# Patient Record
Sex: Female | Born: 1975 | Race: White | Hispanic: No | Marital: Married | Smoking: Never smoker
Health system: Southern US, Community
[De-identification: ages and names within clinical notes are randomized; demographics above are authoritative.]

## PROBLEM LIST (undated history)

## (undated) DIAGNOSIS — Z87440 Personal history of urinary (tract) infections: Secondary | ICD-10-CM

## (undated) DIAGNOSIS — Z8481 Family history of carrier of genetic disease: Secondary | ICD-10-CM

## (undated) DIAGNOSIS — Z87442 Personal history of urinary calculi: Secondary | ICD-10-CM

## (undated) HISTORY — DX: Personal history of urinary (tract) infections: Z87.440

## (undated) HISTORY — DX: Family history of carrier of genetic disease: Z84.81

---

## 2006-02-23 ENCOUNTER — Emergency Department: Payer: Self-pay | Admitting: Emergency Medicine

## 2007-07-06 ENCOUNTER — Emergency Department: Payer: Self-pay | Admitting: Emergency Medicine

## 2007-11-16 ENCOUNTER — Emergency Department: Payer: Self-pay | Admitting: Emergency Medicine

## 2008-04-29 ENCOUNTER — Emergency Department: Payer: Self-pay | Admitting: Emergency Medicine

## 2008-05-07 ENCOUNTER — Ambulatory Visit: Payer: Self-pay | Admitting: Urology

## 2008-05-08 ENCOUNTER — Ambulatory Visit: Payer: Self-pay | Admitting: Urology

## 2008-12-11 ENCOUNTER — Ambulatory Visit: Payer: Self-pay | Admitting: Urology

## 2009-03-26 ENCOUNTER — Emergency Department: Payer: Self-pay | Admitting: Emergency Medicine

## 2009-10-24 HISTORY — PX: TUBAL LIGATION: SHX77

## 2010-06-25 ENCOUNTER — Observation Stay: Payer: Self-pay | Admitting: Obstetrics and Gynecology

## 2010-08-24 ENCOUNTER — Inpatient Hospital Stay (HOSPITAL_COMMUNITY): Admission: AD | Admit: 2010-08-24 | Discharge: 2010-08-24 | Payer: Self-pay | Admitting: Obstetrics and Gynecology

## 2010-08-24 ENCOUNTER — Inpatient Hospital Stay (HOSPITAL_COMMUNITY): Admission: AD | Admit: 2010-08-24 | Discharge: 2010-08-27 | Payer: Self-pay | Admitting: Obstetrics and Gynecology

## 2010-09-15 ENCOUNTER — Ambulatory Visit (HOSPITAL_COMMUNITY)
Admission: RE | Admit: 2010-09-15 | Discharge: 2010-09-15 | Payer: Self-pay | Source: Home / Self Care | Admitting: Obstetrics and Gynecology

## 2011-01-04 LAB — COMPREHENSIVE METABOLIC PANEL
AST: 18 U/L (ref 0–37)
Albumin: 2.9 g/dL — ABNORMAL LOW (ref 3.5–5.2)
Calcium: 8.5 mg/dL (ref 8.4–10.5)
Creatinine, Ser: 0.91 mg/dL (ref 0.4–1.2)
GFR calc Af Amer: >60 mL/min (ref >60)

## 2011-01-04 LAB — CBC
HCT: 29 % — ABNORMAL LOW (ref 36.0–46.0)
HCT: 38.6 % (ref 36.0–46.0)
Hemoglobin: 9.8 g/dL — ABNORMAL LOW (ref 12.0–15.0)
MCH: 30.3 pg (ref 26.0–34.0)
MCHC: 34.3 g/dL (ref 30.0–36.0)
MCV: 88.2 fL (ref 78.0–100.0)
MCV: 89.2 fL (ref 78.0–100.0)
Platelets: 241 10*3/uL (ref 150–400)
Platelets: 291 10*3/uL (ref 150–400)
RBC: 3.25 MIL/uL — ABNORMAL LOW (ref 3.87–5.11)
RBC: 3.59 MIL/uL — ABNORMAL LOW (ref 3.87–5.11)
RBC: 4.37 MIL/uL (ref 3.87–5.11)
WBC: 12.9 10*3/uL — ABNORMAL HIGH (ref 4.0–10.5)
WBC: 8.6 10*3/uL (ref 4.0–10.5)

## 2011-07-14 ENCOUNTER — Emergency Department: Payer: Self-pay | Admitting: *Deleted

## 2013-11-15 ENCOUNTER — Emergency Department: Payer: Self-pay | Admitting: Emergency Medicine

## 2013-11-17 ENCOUNTER — Emergency Department: Payer: Self-pay | Admitting: Emergency Medicine

## 2013-11-17 LAB — URINALYSIS, COMPLETE
BILIRUBIN, UR: NEGATIVE
GLUCOSE, UR: NEGATIVE mg/dL (ref 0–75)
Nitrite: POSITIVE
PH: 5 (ref 4.5–8.0)
Protein: >=500
Specific Gravity: 1.025 (ref 1.003–1.030)

## 2013-11-17 LAB — CBC WITH DIFFERENTIAL/PLATELET
BASOS PCT: 0.3 %
Basophil #: 0 10*3/uL (ref 0.0–0.1)
Eosinophil #: 0.1 10*3/uL (ref 0.0–0.7)
Eosinophil %: 1.4 %
HCT: 42.6 % (ref 35.0–47.0)
HGB: 14.1 g/dL (ref 12.0–16.0)
LYMPHS PCT: 19.2 %
Lymphocyte #: 1.2 10*3/uL (ref 1.0–3.6)
MCH: 28.8 pg (ref 26.0–34.0)
MCHC: 33 g/dL (ref 32.0–36.0)
MCV: 87 fL (ref 80–100)
MONO ABS: 0.5 x10 3/mm (ref 0.2–0.9)
Monocyte %: 7.9 %
NEUTROS ABS: 4.4 10*3/uL (ref 1.4–6.5)
NEUTROS PCT: 71.2 %
Platelet: 167 10*3/uL (ref 150–440)
RBC: 4.88 10*6/uL (ref 3.80–5.20)
RDW: 13.4 % (ref 11.5–14.5)
WBC: 6.2 10*3/uL (ref 3.6–11.0)

## 2013-11-17 LAB — COMPREHENSIVE METABOLIC PANEL
ALBUMIN: 3.8 g/dL (ref 3.4–5.0)
AST: 26 U/L (ref 15–37)
Alkaline Phosphatase: 67 U/L
Anion Gap: 4 — ABNORMAL LOW (ref 7–16)
BILIRUBIN TOTAL: 0.2 mg/dL (ref 0.2–1.0)
BUN: 12 mg/dL (ref 7–18)
CREATININE: 1.03 mg/dL (ref 0.60–1.30)
Calcium, Total: 8.3 mg/dL — ABNORMAL LOW (ref 8.5–10.1)
Chloride: 106 mmol/L (ref 98–107)
Co2: 28 mmol/L (ref 21–32)
EGFR (African American): >60
EGFR (Non-African Amer.): >60
GLUCOSE: 90 mg/dL (ref 65–99)
Osmolality: 275 (ref 275–301)
POTASSIUM: 3.4 mmol/L — AB (ref 3.5–5.1)
SGPT (ALT): 22 U/L (ref 12–78)
Sodium: 138 mmol/L (ref 136–145)
TOTAL PROTEIN: 6.8 g/dL (ref 6.4–8.2)

## 2013-11-20 LAB — URINE CULTURE

## 2015-04-20 LAB — HM PAP SMEAR

## 2015-04-22 ENCOUNTER — Ambulatory Visit: Payer: Self-pay | Attending: Oncology

## 2015-04-22 ENCOUNTER — Ambulatory Visit
Admission: RE | Admit: 2015-04-22 | Discharge: 2015-04-22 | Disposition: A | Discharge status: Home / Self Care | Payer: Self-pay | Source: Ambulatory Visit | Attending: Oncology | Admitting: Oncology

## 2015-04-22 VITALS — BP 101/68 | HR 84 | Temp 98.0°F | Resp 16 | Ht 64.96 in | Wt 156.1 lb

## 2015-04-22 DIAGNOSIS — Z0000 Encounter for general adult medical examination without abnormal findings: Secondary | ICD-10-CM

## 2015-04-22 NOTE — Progress Notes (Signed)
Subjective:     Patient ID: Paige [name], female   DOB: [birth year], 39 y.o.   MRN: [id number]  HPI   Review of Systems     Objective:   Physical Exam  Pulmonary/Chest: Right breast exhibits no inverted nipple, no mass, no nipple discharge, no skin change and no tenderness. Left breast exhibits no inverted nipple, no mass, no nipple discharge, no skin change and no tenderness. Breasts are symmetrical.  Genitourinary: No labial fusion. There is no rash, tenderness, lesion or injury on the right labia. There is no rash, tenderness, lesion or injury on the left labia. Uterus is not deviated. Cervix exhibits no motion tenderness, no discharge and no friability. Right adnexum displays no mass, no tenderness and no fullness. Left adnexum displays no mass, no tenderness and no fullness. No erythema or tenderness in the vagina. No vaginal discharge found.       Assessment:     39 year old patient presents for BCCCP clinic visitPatient screened, and meets BCCCP eligibility.  Patient does not have insurance, Medicare or Medicaid.  Handout given on Affordable Care Act.  CBE unremarkable. Instructed patient on breast self-exam using teach back method. Patient rports her mother had positive BRCA testin at [facility], and she plans for testing.  She has a 22 year old daughter, a 33 year old son, and a 43 year old son.         Plan:     Sent for baseline bilateral screening mammogram. Specimen collected  for pap.

## 2015-04-22 NOTE — Addendum Note (Signed)
Addended byScarlett Presto: Natacha Jepsen F on: 04/22/2015 04:47 PM   Modules accepted: Orders

## 2015-04-24 ENCOUNTER — Encounter: Payer: Self-pay | Admitting: *Deleted

## 2015-04-24 DIAGNOSIS — N6489 Other specified disorders of breast: Secondary | ICD-10-CM

## 2015-04-24 DIAGNOSIS — Z1371 Encounter for nonprocreative screening for genetic disease carrier status: Secondary | ICD-10-CM

## 2015-04-24 HISTORY — DX: Encounter for nonprocreative screening for genetic disease carrier status: Z13.71

## 2015-04-24 LAB — PAP LB AND HPV HIGH-RISK
HPV, high-risk: NEGATIVE
PAP Smear Comment: 0

## 2015-04-28 ENCOUNTER — Ambulatory Visit
Admission: RE | Admit: 2015-04-28 | Discharge: 2015-04-28 | Disposition: A | Discharge status: Home / Self Care | Payer: Self-pay | Source: Ambulatory Visit | Attending: Oncology | Admitting: Oncology

## 2015-04-28 DIAGNOSIS — N6001 Solitary cyst of right breast: Secondary | ICD-10-CM | POA: Insufficient documentation

## 2015-04-28 DIAGNOSIS — N6489 Other specified disorders of breast: Secondary | ICD-10-CM

## 2015-04-28 DIAGNOSIS — N63 Unspecified lump in breast: Secondary | ICD-10-CM | POA: Insufficient documentation

## 2015-04-30 ENCOUNTER — Other Ambulatory Visit: Payer: Self-pay

## 2015-04-30 DIAGNOSIS — N63 Unspecified lump in unspecified breast: Secondary | ICD-10-CM

## 2015-04-30 NOTE — Progress Notes (Signed)
Scheduled patient for 6 month follow-up diagnostic mammogram of right breast with right breast ultrasound on Friday January 6, at 9:20 a.m.Marland Kitchen.  Left message for patient to call back for notification of appointment.

## 2015-04-30 NOTE — Progress Notes (Signed)
Notified patient of 6 month follow-up mammogram, and ultrasound 10/30/2015 at 9:20.  Also reported to patient ASCUS with negative HPV pap results.  She will be due for next pap in 5 years per BCCCP guidelines.

## 2015-10-30 ENCOUNTER — Ambulatory Visit: Payer: Self-pay | Attending: Oncology

## 2015-10-30 ENCOUNTER — Other Ambulatory Visit: Payer: Self-pay

## 2015-12-04 ENCOUNTER — Ambulatory Visit
Admission: RE | Admit: 2015-12-04 | Discharge: 2015-12-04 | Disposition: A | Discharge status: Home / Self Care | Payer: Self-pay | Source: Ambulatory Visit | Attending: Oncology | Admitting: Oncology

## 2015-12-04 ENCOUNTER — Other Ambulatory Visit: Payer: Self-pay

## 2015-12-04 DIAGNOSIS — N63 Unspecified lump in unspecified breast: Secondary | ICD-10-CM

## 2016-01-18 LAB — HM MAMMOGRAPHY

## 2016-03-18 ENCOUNTER — Encounter: Payer: Self-pay | Admitting: Internal Medicine

## 2016-03-18 ENCOUNTER — Ambulatory Visit (INDEPENDENT_AMBULATORY_CARE_PROVIDER_SITE_OTHER): Payer: BLUE CROSS/BLUE SHIELD | Admitting: Internal Medicine

## 2016-03-18 VITALS — BP 110/80 | HR 74 | Temp 98.1°F | Ht 66.0 in | Wt 152.0 lb

## 2016-03-18 DIAGNOSIS — R635 Abnormal weight gain: Secondary | ICD-10-CM

## 2016-03-18 DIAGNOSIS — B351 Tinea unguium: Secondary | ICD-10-CM

## 2016-03-18 DIAGNOSIS — R5383 Other fatigue: Secondary | ICD-10-CM | POA: Diagnosis not present

## 2016-03-18 DIAGNOSIS — E049 Nontoxic goiter, unspecified: Secondary | ICD-10-CM | POA: Diagnosis not present

## 2016-03-18 DIAGNOSIS — E010 Iodine-deficiency related diffuse (endemic) goiter: Secondary | ICD-10-CM

## 2016-03-18 DIAGNOSIS — R490 Dysphonia: Secondary | ICD-10-CM | POA: Diagnosis not present

## 2016-03-18 DIAGNOSIS — K2100 Gastro-esophageal reflux disease with esophagitis, without bleeding: Secondary | ICD-10-CM

## 2016-03-18 DIAGNOSIS — F411 Generalized anxiety disorder: Secondary | ICD-10-CM

## 2016-03-18 LAB — CBC WITH DIFFERENTIAL/PLATELET
BASOS ABS: 0 10*3/uL (ref 0.0–0.1)
Basophils Relative: 0.4 % (ref 0.0–3.0)
EOS ABS: 0.7 10*3/uL (ref 0.0–0.7)
EOS PCT: 7.9 % — AB (ref 0.0–5.0)
HCT: 42 % (ref 36.0–46.0)
Hemoglobin: 13.9 g/dL (ref 12.0–15.0)
LYMPHS ABS: 2.9 10*3/uL (ref 0.7–4.0)
Lymphocytes Relative: 32.5 % (ref 12.0–46.0)
MCHC: 33.1 g/dL (ref 30.0–36.0)
MCV: 84.8 fl (ref 78.0–100.0)
MONO ABS: 0.7 10*3/uL (ref 0.1–1.0)
Monocytes Relative: 7.4 % (ref 3.0–12.0)
NEUTROS PCT: 51.8 % (ref 43.0–77.0)
Neutro Abs: 4.6 10*3/uL (ref 1.4–7.7)
Platelets: 353 10*3/uL (ref 150.0–400.0)
RBC: 4.95 Mil/uL (ref 3.87–5.11)
RDW: 13.7 % (ref 11.5–15.5)
WBC: 8.9 10*3/uL (ref 4.0–10.5)

## 2016-03-18 LAB — COMPREHENSIVE METABOLIC PANEL
ALBUMIN: 4.8 g/dL (ref 3.5–5.2)
ALK PHOS: 60 U/L (ref 39–117)
ALT: 14 U/L (ref 0–35)
AST: 18 U/L (ref 0–37)
BILIRUBIN TOTAL: 0.4 mg/dL (ref 0.2–1.2)
BUN: 9 mg/dL (ref 6–23)
CALCIUM: 9.6 mg/dL (ref 8.4–10.5)
CO2: 27 mEq/L (ref 19–32)
CREATININE: 0.9 mg/dL (ref 0.40–1.20)
Chloride: 104 mEq/L (ref 96–112)
GFR: 73.09 mL/min (ref >60.00)
Glucose, Bld: 72 mg/dL (ref 70–99)
Potassium: 3.7 mEq/L (ref 3.5–5.1)
SODIUM: 140 meq/L (ref 135–145)
TOTAL PROTEIN: 7 g/dL (ref 6.0–8.3)

## 2016-03-18 LAB — VITAMIN B12: VITAMIN B 12: 711 pg/mL (ref 211–911)

## 2016-03-18 LAB — TSH: TSH: 1.87 u[IU]/mL (ref 0.35–4.50)

## 2016-03-18 LAB — IBC PANEL
IRON: 55 ug/dL (ref 42–145)
Saturation Ratios: 15.2 % — ABNORMAL LOW (ref 20.0–50.0)
TRANSFERRIN: 259 mg/dL (ref 212.0–360.0)

## 2016-03-18 MED ORDER — ALPRAZOLAM 0.5 MG PO TABS: 0.2500 mg | ORAL_TABLET | Freq: Two times a day (BID) | ORAL | Status: DC | PRN

## 2016-03-18 MED ORDER — OMEPRAZOLE 40 MG PO CPDR: 40.0000 mg | DELAYED_RELEASE_CAPSULE | Freq: Every day | ORAL | Status: DC

## 2016-03-18 NOTE — Progress Notes (Addendum)
Subjective:  Patient ID: Paige [name], female    DOB: January 02, 1976  Age: 40 y.o. MRN: [id number]  CC: The primary encounter diagnosis was Thyromegaly. Diagnoses of Anxiety state, Weight gain, Hoarseness, Gastroesophageal reflux disease with esophagitis, Other fatigue, and Onychomycosis of toenail were also pertinent to this visit.  HPI Paige [name] presents for establishment of care   Cc:  Fatigue.  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  She also has 3 children:   50 yr old  daughter , 33 yr old son (both from first marriage) and a  79 yr old son who has been hard to control due to uncontrolled behaviroral issues and is in process of being evaluated. Her 22 yr old son had a traumatic brain injury after surviving a MVA last yr and lives at home as well. Her husband has untreated bipolar disorder,  OCD and ADD and has just started his own business.  She also has a grandchild and an estranged step father who continues to rely on her heavily for emotional support despite her mother's divorce from him.  She feels stressed out constantly.  And often goes to be crying because she knows she has been iritable toward her sons.   She does not  feel that her husband shares in the domestic family responsibilities as much as he could,  And she has no time to herself.   Trouble focusing, feels she has ADHD  Has gained 30 lbs over the  Last 3 years.  No regular exercise. Father has an exercise room with equipment at the office that she can use but has not.   Voice has become hoarse.  Has GERD but does not treat consistently,  For the past  4 or 5 years.  Symptoms occur day and night.  Aggravated by certain foods,  Some dysphagia.   No dry cough. Use prn prilosec   Using lamisil for toenail  fungus prescribed by Dr Nicole Kindred,  lfts were reportedly normal   Has regularly occurring menses,,  5 days in duration . PAP smear ASCUS HPV negative June 2016 Mammogram every 6  months due to [facility] .  Last one March . BRCA 1 negative  Mom has BRCA gene . Maternal Aunt and MGM had ovarian CA, leukemia and breast  CA    History Ladawna has a past medical history of UTI (urinary tract infection) and Kidney stones.   She has past surgical history that includes Tubal ligation (2011).   Her family history includes BRCA 1/2 in her mother; Breast cancer (age of onset: 40) in her maternal aunt; Breast cancer (age of onset: 105) in her maternal grandmother.She reports that she has never smoked. She does not have any smokeless tobacco history on file. She reports that she does not drink alcohol or use illicit drugs.  No outpatient prescriptions prior to visit.   No facility-administered medications prior to visit.    Review of Systems:  Patient denies headache, fevers, malaise, unintentional weight loss, skin rash, eye pain, sinus congestion and sinus pain, sore throat, dysphagia,  hemoptysis , cough, dyspnea, wheezing, chest pain, palpitations, orthopnea, edema, abdominal pain, nausea, melena, diarrhea, constipation, flank pain, dysuria, hematuria, urinary  Frequency, nocturia, numbness, tingling, seizures,  Focal weakness, Loss of consciousness,  Tremor, insomnia, depression, anxiety, and suicidal ideation.     Objective:  BP 110/80 mmHg  Pulse 74  Temp(Src) 98.1 F (36.7 C) (Oral)  Ht _0  (1.676 m)  Wt 152 lb (68.947 kg)  BMI 24.55 kg/m2  Physical Exam:  General appearance: alert, cooperative and appears stated age Ears: normal TM's and external ear canals both ears Throat: lips, mucosa, and tongue normal; teeth and gums normal Neck: no adenopathy, no carotid bruit, supple, symmetrical, trachea midline and thyroid diffusely  enlarged, symmetric, no tenderness/mass/nodules Back: symmetric, no curvature. ROM normal. No CVA tenderness. Lungs: clear to auscultation bilaterally Heart: regular rate and rhythm, S1, S2 normal, no murmur, click, rub or gallop Abdomen:  soft, non-tender; bowel sounds normal; no masses,  no organomegaly Pulses: 2+ and symmetric Skin: Skin color, texture, turgor normal. No rashes or lesions Lymph nodes: Cervical, supraclavicular, and axillary nodes normal.   Assessment & Plan:   Problem List Items Addressed This Visit    Thyromegaly - Primary    On exam,  Mild,  With hoarseness, dysphagia and faitgue reported.  Checking [place] and TSH  Lab Results  Component Value Date   TSH 1.87 03/18/2016         Relevant Orders   TSH (Completed)   US SOFT TISSUE NECK   Hoarseness    Unclear if this is due to goiter or persistent GERD.  Prescribing PPI and ordering [place].  If [place] is normal may need ENT evaluation       Other fatigue    Likely multifactorial with significant emotional demands of raising 2 sons,  Both with social/neuroligic dysfunction,  And a husband who also has emotional difficulties.  Thyroid and anemia ruled out.  Stressed need to be self supportive and guarding of her time.  Encouraged regular exercise ,  Healthy diet. Prn alprzolam for insomnia   Lab Results  Component Value Date   WBC 8.9 03/18/2016   HGB 13.9 03/18/2016   HCT 42.0 03/18/2016   MCV 84.8 03/18/2016   PLT 353.0 03/18/2016         Relevant Orders   Comprehensive metabolic panel (Completed)   TSH (Completed)   CBC with Differential/Platelet (Completed)   IBC panel (Completed)   B12 (Completed)   Gastroesophageal reflux disease with esophagitis    Without cough.  Present for 4-5 years  Daily PPI advised,       Onychomycosis of toenail    Treated with [facility] by Dr Nicole Kindred  Lab Results  Component Value Date   ALT 14 03/18/2016   AST 18 03/18/2016   ALKPHOS 60 03/18/2016   BILITOT 0.4 03/18/2016         Relevant Medications   terbinafine (LAMISIL) 250 MG tablet   Anxiety state    Discussed SSRI therapy but she is very concerned about gaining more weight.  Prn alprazolam,  0.5 mg 1/2 tablet twice daily maximum dose.        Relevant Medications   ALPRAZolam (XANAX) 0.5 MG tablet    Other Visit Diagnoses    Weight gain           I am having Ms. Molder start on ALPRAZolam and omeprazole. I am also having her maintain her terbinafine.  Meds ordered this encounter  Medications  . terbinafine (LAMISIL) 250 MG tablet    Sig: Take 250 mg by mouth daily.  Marland Kitchen ALPRAZolam (XANAX) 0.5 MG tablet    Sig: Take 0.5 tablets (0.25 mg total) by mouth 2 (two) times daily as needed for anxiety or sleep.    Dispense:  30 tablet    Refill:  2  . omeprazole (PRILOSEC) 40 MG capsule    Sig:  Take 1 capsule (40 mg total) by mouth daily.    Dispense:  30 capsule    Refill:  3    There are no discontinued medications.  Follow-up: No Follow-up on file.   Crecencio Mc, MD

## 2016-03-18 NOTE — Patient Instructions (Signed)
Alprazolam 1/2 tablet not more than twice daily for anxiety/insomnia  Prilosec 40 mg daily in the AM  For reflux  US thyroid to be scheduled  RTC one month   Gastroesophageal Reflux Disease, Adult Normally, food travels down the esophagus and stays in the stomach to be digested. However, when a person has gastroesophageal reflux disease (GERD), food and stomach acid move back up into the esophagus. When this happens, the esophagus becomes sore and inflamed. Over time, GERD can create small holes (ulcers) in the lining of the esophagus.  CAUSES This condition is caused by a problem with the muscle between the esophagus and the stomach (lower esophageal sphincter, or LES). Normally, the LES muscle closes after food passes through the esophagus to the stomach. When the LES is weakened or abnormal, it does not close properly, and that allows food and stomach acid to go back up into the esophagus. The LES can be weakened by certain dietary substances, medicines, and medical conditions, including:  Tobacco use.  Pregnancy.  Having a hiatal hernia.  Heavy alcohol use.  Certain foods and beverages, such as coffee, chocolate, onions, and peppermint. RISK FACTORS This condition is more likely to develop in:  People who have an increased body weight.  People who have connective tissue disorders.  People who use NSAID medicines. SYMPTOMS Symptoms of this condition include:  Heartburn.  Difficult or painful swallowing.  The feeling of having a lump in the throat.  Abitter taste in the mouth.  Bad breath.  Having a large amount of saliva.  Having an upset or bloated stomach.  Belching.  Chest pain.  Shortness of breath or wheezing.  Ongoing (chronic) cough or a night-time cough.  Wearing away of tooth enamel.  Weight loss. Different conditions can cause chest pain. Make sure to see your health care provider if you experience chest pain. DIAGNOSIS Your health care  provider will take a medical history and perform a physical exam. To determine if you have mild or severe GERD, your health care provider may also monitor how you respond to treatment. You may also have other tests, including:  An endoscopy toexamine your stomach and esophagus with a small camera.  A test thatmeasures the acidity level in your esophagus.  A test thatmeasures how much pressure is on your esophagus.  A barium swallow or modified barium swallow to show the shape, size, and functioning of your esophagus. TREATMENT The goal of treatment is to help relieve your symptoms and to prevent complications. Treatment for this condition may vary depending on how severe your symptoms are. Your health care provider may recommend:  Changes to your diet.  Medicine.  Surgery. HOME CARE INSTRUCTIONS Diet  Follow a diet as recommended by your health care provider. This may involve avoiding foods and drinks such as:  Coffee and tea (with or without caffeine).  Drinks that containalcohol.  Energy drinks and sports drinks.  Carbonated drinks or sodas.  Chocolate and cocoa.  Peppermint and mint flavorings.  Garlic and onions.  Horseradish.  Spicy and acidic foods, including peppers, chili powder, curry powder, vinegar, hot sauces, and barbecue sauce.  Citrus fruit juices and citrus fruits, such as oranges, lemons, and limes.  Tomato-based foods, such as red sauce, chili, salsa, and pizza with red sauce.  Fried and fatty foods, such as donuts, french fries, potato chips, and high-fat dressings.  High-fat meats, such as hot dogs and fatty cuts of red and white meats, such as rib eye steak, sausage,  ham, and bacon.  High-fat dairy items, such as whole milk, butter, and cream cheese.  Eat small, frequent meals instead of large meals.  Avoid drinking large amounts of liquid with your meals.  Avoid eating meals during the 2-3 hours before bedtime.  Avoid lying down right  after you eat.  Do not exercise right after you eat. General Instructions  Pay attention to any changes in your symptoms.  Take over-the-counter and prescription medicines only as told by your health care provider. Do not take aspirin, ibuprofen, or other NSAIDs unless your health care provider told you to do so.  Do not use any tobacco products, including cigarettes, chewing tobacco, and e-cigarettes. If you need help quitting, ask your health care provider.  Wear loose-fitting clothing. Do not wear anything tight around your waist that causes pressure on your abdomen.  Raise (elevate) the head of your bed 6 inches (15cm).  Try to reduce your stress, such as with yoga or meditation. If you need help reducing stress, ask your health care provider.  If you are overweight, reduce your weight to an amount that is healthy for you. Ask your health care provider for guidance about a safe weight loss goal.  Keep all follow-up visits as told by your health care provider. This is important. SEEK MEDICAL CARE IF:  You have new symptoms.  You have unexplained weight loss.  You have difficulty swallowing, or it hurts to swallow.  You have wheezing or a persistent cough.  Your symptoms do not improve with treatment.  You have a hoarse voice. SEEK IMMEDIATE MEDICAL CARE IF:  You have pain in your arms, neck, jaw, teeth, or back.  You feel sweaty, dizzy, or light-headed.  You have chest pain or shortness of breath.  You vomit and your vomit looks like blood or coffee grounds.  You faint.  Your stool is bloody or black.  You cannot swallow, drink, or eat.   This information is not intended to replace advice given to you by your health care provider. Make sure you discuss any questions you have with your health care provider.   Document Released: 07/20/2005 Document Revised: 07/01/2015 Document Reviewed: 02/04/2015 Elsevier Interactive Patient Education [employer]2016 Elsevier Inc.

## 2016-03-18 NOTE — Progress Notes (Signed)
Pre visit review using our clinic review tool, if applicable. No additional management support is needed unless otherwise documented below in the visit note. 

## 2016-03-19 ENCOUNTER — Encounter: Payer: Self-pay | Admitting: Internal Medicine

## 2016-03-19 DIAGNOSIS — B351 Tinea unguium: Secondary | ICD-10-CM | POA: Insufficient documentation

## 2016-03-19 DIAGNOSIS — R490 Dysphonia: Secondary | ICD-10-CM | POA: Insufficient documentation

## 2016-03-19 DIAGNOSIS — K2100 Gastro-esophageal reflux disease with esophagitis, without bleeding: Secondary | ICD-10-CM | POA: Insufficient documentation

## 2016-03-19 DIAGNOSIS — E041 Nontoxic single thyroid nodule: Secondary | ICD-10-CM | POA: Insufficient documentation

## 2016-03-19 DIAGNOSIS — R5383 Other fatigue: Secondary | ICD-10-CM | POA: Insufficient documentation

## 2016-03-19 DIAGNOSIS — F411 Generalized anxiety disorder: Secondary | ICD-10-CM | POA: Insufficient documentation

## 2016-03-19 NOTE — Assessment & Plan Note (Signed)
On exam,  Mild,  With hoarseness, dysphagia and faitgue reported.  Checking US and TSH  Lab Results  Component Value Date   TSH 1.87 03/18/2016

## 2016-03-19 NOTE — Assessment & Plan Note (Signed)
Likely multifactorial with significant emotional demands of raising 2 sons,  Both with social/neuroligic dysfunction,  And a husband who also has emotional difficulties.  Thyroid and anemia ruled out.  Stressed need to be self supportive and guarding of her time.  Encouraged regular exercise ,  Healthy diet. Prn alprzolam for insomnia   Lab Results  Component Value Date   WBC 8.9 03/18/2016   HGB 13.9 03/18/2016   HCT 42.0 03/18/2016   MCV 84.8 03/18/2016   PLT 353.0 03/18/2016

## 2016-03-19 NOTE — Assessment & Plan Note (Signed)
Treated with Lamisili by Dr Roseanne RenoStewart  Lab Results  Component Value Date   ALT 14 03/18/2016   AST 18 03/18/2016   ALKPHOS 60 03/18/2016   BILITOT 0.4 03/18/2016

## 2016-03-19 NOTE — Assessment & Plan Note (Signed)
Without cough.  Present for 4-5 years  Daily PPI advised,

## 2016-03-19 NOTE — Assessment & Plan Note (Signed)
Unclear if this is due to goiter or persistent GERD.  Prescribing PPI and ordering US.  If US is normal may need ENT evaluation

## 2016-03-19 NOTE — Assessment & Plan Note (Signed)
Discussed SSRI therapy but she is very concerned about gaining more weight.  Prn alprazolam,  0.5 mg 1/2 tablet twice daily maximum dose.

## 2016-03-22 ENCOUNTER — Encounter: Payer: Self-pay | Admitting: *Deleted

## 2016-03-22 ENCOUNTER — Telehealth: Payer: Self-pay | Admitting: Internal Medicine

## 2016-03-22 NOTE — Telephone Encounter (Signed)
Patient would like a call back regarding her labs that was drawn on 5/26. Pt cb [phone]

## 2016-03-22 NOTE — Telephone Encounter (Signed)
Patient notified voiced understanding

## 2016-03-24 ENCOUNTER — Ambulatory Visit: Payer: BLUE CROSS/BLUE SHIELD

## 2016-03-25 ENCOUNTER — Ambulatory Visit
Admission: RE | Admit: 2016-03-25 | Discharge: 2016-03-25 | Disposition: A | Discharge status: Home / Self Care | Payer: BLUE CROSS/BLUE SHIELD | Source: Ambulatory Visit | Attending: Internal Medicine | Admitting: Internal Medicine

## 2016-03-25 DIAGNOSIS — E049 Nontoxic goiter, unspecified: Secondary | ICD-10-CM | POA: Insufficient documentation

## 2016-03-25 DIAGNOSIS — E010 Iodine-deficiency related diffuse (endemic) goiter: Secondary | ICD-10-CM

## 2016-03-27 ENCOUNTER — Encounter: Payer: Self-pay | Admitting: Internal Medicine

## 2016-03-27 ENCOUNTER — Other Ambulatory Visit: Payer: Self-pay | Admitting: Internal Medicine

## 2016-03-27 DIAGNOSIS — E041 Nontoxic single thyroid nodule: Secondary | ICD-10-CM

## 2016-04-21 ENCOUNTER — Encounter: Payer: Self-pay | Admitting: Internal Medicine

## 2016-04-21 ENCOUNTER — Ambulatory Visit (INDEPENDENT_AMBULATORY_CARE_PROVIDER_SITE_OTHER): Payer: BLUE CROSS/BLUE SHIELD | Admitting: Internal Medicine

## 2016-04-21 VITALS — BP 104/78 | HR 86 | Resp 14 | Ht 66.0 in | Wt 151.0 lb

## 2016-04-21 DIAGNOSIS — R41840 Attention and concentration deficit: Secondary | ICD-10-CM

## 2016-04-21 DIAGNOSIS — R928 Other abnormal and inconclusive findings on diagnostic imaging of breast: Secondary | ICD-10-CM | POA: Diagnosis not present

## 2016-04-21 DIAGNOSIS — E041 Nontoxic single thyroid nodule: Secondary | ICD-10-CM

## 2016-04-21 DIAGNOSIS — R490 Dysphonia: Secondary | ICD-10-CM | POA: Diagnosis not present

## 2016-04-21 DIAGNOSIS — F411 Generalized anxiety disorder: Secondary | ICD-10-CM

## 2016-04-21 MED ORDER — ALPRAZOLAM 0.25 MG PO TABS: 0.2500 mg | ORAL_TABLET | Freq: Every day | ORAL | Status: DC | PRN

## 2016-04-21 MED ORDER — SERTRALINE HCL 50 MG PO TABS: 50.0000 mg | ORAL_TABLET | Freq: Every day | ORAL | Status: DC

## 2016-04-21 NOTE — Patient Instructions (Signed)
Please start the  Sertraline (generic for Zoloft ) at 1/2 tablet daily in the evening for the first few days to avoid nausea.  You can increase to a full tablet after a week if you have not developed side effects of nausea.  If the medicatio interferes with your sleep, take it in the morning instead   We can increase the dose to 100 mg after 3 weeks if you do not feel any improvement in your mood.  Just let me know  I have refilled the alprazolam at the lowest dose so you can cut it in half instead of 1/4's

## 2016-04-21 NOTE — Progress Notes (Signed)
Subjective:  Patient ID: Paige [name], female    DOB: [birth year]  Age: 40 y.o. MRN: [id number]  CC: The primary encounter diagnosis was Concentration deficit. Diagnoses of Abnormal mammogram of right breast, Thyroid nodule, Hoarseness, and Anxiety state were also pertinent to this visit.  HPI Paige GhaziMelissa Gomes presents for follow up on multiple issues.  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  She has been cutting the alprazolam 0.5 mg tablet into 1/4's and and it using it prn insomnia.  he feels anxious during the day and reports having difficulty concentrating and focusing and reports that the symptoms have been present since junior high but never treated.  She admits to trying one of her husband's Adderall and felt that her concentration improved significantly with no side effects.     Outpatient Prescriptions Prior to Visit  Medication Sig Dispense Refill  . omeprazole (PRILOSEC) 40 MG capsule Take 1 capsule (40 mg total) by mouth daily. 30 capsule 3  . ALPRAZolam (XANAX) 0.5 MG tablet Take 0.5 tablets (0.25 mg total) by mouth 2 (two) times daily as needed for anxiety or sleep. (Patient not taking: Reported on 04/21/2016) 30 tablet 2  . terbinafine (LAMISIL) 250 MG tablet Take 250 mg by mouth daily. Reported on 04/21/2016     No facility-administered medications prior to visit.    Review of Systems;  Patient denies headache, fevers, malaise, unintentional weight loss, skin rash, eye pain, sinus congestion and sinus pain, sore throat, dysphagia,  hemoptysis , cough, dyspnea, wheezing, chest pain, palpitations, orthopnea, edema, abdominal pain, nausea, melena, diarrhea, constipation, flank pain, dysuria, hematuria, urinary  Frequency, nocturia,  numbness, tingling, seizures,  Focal weakness, Loss of consciousness,  Tremor, insomnia, depression,, and suicidal ideation.      Objective:  BP 104/78 mmHg  Pulse 86  Resp 14  Ht 5\' 6"  (1.676 m)  Wt 151 lb (68.493 kg)  BMI 24.38 kg/m2  SpO2 98%  LMP 04/11/2016  BP Readings from Last 3 Encounters:  04/21/16 104/78  03/18/16 110/80  04/22/15 101/68    Wt Readings from Last 3 Encounters:  04/21/16 151 lb (68.493 kg)  03/18/16 152 lb (68.947 kg)  04/22/15 156 lb 1.4 oz (70.8 kg)    General appearance: alert, cooperative and appears stated age Ears: normal TM's and external ear canals both ears Throat: lips, mucosa, and tongue normal; teeth and gums normal Neck: no adenopathy, no carotid bruit, supple, symmetrical, trachea midline and thyroid not enlarged, symmetric, no tenderness/mass/nodules Back: symmetric, no curvature. ROM normal. No CVA tenderness. Lungs: clear to auscultation bilaterally Heart: regular rate and rhythm, S1, S2 normal, no murmur, click, rub or gallop Abdomen: soft, non-tender; bowel sounds normal; no masses,  no organomegaly Pulses: 2+ and symmetric Skin: Skin color, texture, turgor normal. No rashes or lesions Lymph nodes: Cervical, supraclavicular, and axillary nodes normal.  No results found for: HGBA1C  Lab Results  Component Value Date   CREATININE 0.90 03/18/2016   CREATININE 1.03 11/17/2013   CREATININE 0.91 08/25/2010    Lab Results  Component Value Date   WBC 8.9 03/18/2016   HGB 13.9 03/18/2016   HCT 42.0 03/18/2016   PLT 353.0 03/18/2016   GLUCOSE 72 03/18/2016   ALT 14 03/18/2016   AST  18 03/18/2016   NA 140 03/18/2016   K 3.7 03/18/2016   CL 104 03/18/2016   CREATININE 0.90 03/18/2016   BUN 9 03/18/2016   CO2 27 03/18/2016   TSH 1.87 03/18/2016    [place] Soft Tissue Neck  03/26/2016  CLINICAL DATA:  Thyromegaly, dysphagia EXAM: THYROID ULTRASOUND TECHNIQUE: Ultrasound examination of the thyroid gland and adjacent soft tissues  was performed. COMPARISON:  None. FINDINGS: Right thyroid lobe Measurements: 4.3 x 1.2 x 1.4 cm. Homogeneous background echotexture. 4 mm hypoechoic solid nodule in the right mid upper pole incidentally noted. No other significant right thyroid abnormality. Left thyroid lobe Measurements: 4.0 x 1.0 x 1.3 cm.  No nodules visualized. Isthmus Thickness: 2 mm.  No nodules visualized. Lymphadenopathy Benign-appearing cervical lymph nodes bilaterally.  No adenopathy. IMPRESSION: Incidental 4 mm right upper pole hypoechoic solid nodule. No other significant finding. Electronically Signed   By: Judie PetitM.  Shick M.D.   On: 03/26/2016 08:33    Assessment & Plan:   Problem List Items Addressed This Visit    Thyroid nodule    She has been referred to Endocrinology for workup of a 4 mm nodule.  Thyroid function is normal.  Lab Results  Component Value Date   TSH 1.87 03/18/2016         Hoarseness    Not due to goiter,  Improving with PPI use for presumed GERD       Anxiety state    Trial of zoloft advised alprazolam dose reduced to 0.25 mg .       Relevant Medications   sertraline (ZOLOFT) 50 MG tablet   ALPRAZolam (XANAX) 0.25 MG tablet   Concentration deficit - Primary    Her symptoms have been present since adolescence with no prior formal evaluation .   Referral to Dr Reggy EyeAltabet for formal evaluation .        Relevant Orders   Ambulatory referral to Psychiatry   Abnormal mammogram of right breast    6 month follow up has been ordered      Relevant Orders   MM Digital Diagnostic Bilat   US BREAST COMPLETE UNI RIGHT INC AXILLA      I have discontinued Ms. Gengler's terbinafine. I have also changed her ALPRAZolam. Additionally, I am having her start on sertraline. Lastly, I am having her maintain her omeprazole.  Meds ordered this encounter  Medications  . sertraline (ZOLOFT) 50 MG tablet    Sig: Take 1 tablet (50 mg total) by mouth daily.    Dispense:  30 tablet    Refill:  3  .  ALPRAZolam (XANAX) 0.25 MG tablet    Sig: Take 1 tablet (0.25 mg total) by mouth daily as needed for anxiety or sleep.    Dispense:  30 tablet    Refill:  5    PHARMACY NOTE DOSE   CHANGE    Medications Discontinued During This Encounter  Medication Reason  . ALPRAZolam (XANAX) 0.5 MG tablet Reorder  . terbinafine (LAMISIL) 250 MG tablet   A total of 25 minutes of face to face time was spent with patient more than half of which was spent in counselling about the above mentioned conditions  and coordination of care   Follow-up: Return in about 6 months (around 10/21/2016).   Sherlene ShamsULLO, Kaelen Caughlin L, MD

## 2016-04-22 ENCOUNTER — Other Ambulatory Visit: Payer: Self-pay | Admitting: Internal Medicine

## 2016-04-22 DIAGNOSIS — R928 Other abnormal and inconclusive findings on diagnostic imaging of breast: Secondary | ICD-10-CM

## 2016-04-23 DIAGNOSIS — R41840 Attention and concentration deficit: Secondary | ICD-10-CM | POA: Insufficient documentation

## 2016-04-23 DIAGNOSIS — R928 Other abnormal and inconclusive findings on diagnostic imaging of breast: Secondary | ICD-10-CM | POA: Insufficient documentation

## 2016-04-23 NOTE — Assessment & Plan Note (Signed)
Trial of zoloft advised alprazolam dose reduced to 0.25 mg .

## 2016-04-23 NOTE — Assessment & Plan Note (Signed)
Her symptoms have been present since adolescence with no prior formal evaluation .   Referral to Dr Reggy EyeAltabet for formal evaluation .

## 2016-04-23 NOTE — Assessment & Plan Note (Signed)
She has been referred to Endocrinology for workup of a 4 mm nodule.  Thyroid function is normal.  Lab Results  Component Value Date   TSH 1.87 03/18/2016

## 2016-04-23 NOTE — Assessment & Plan Note (Signed)
Not due to goiter,  Improving with PPI use for presumed GERD

## 2016-04-23 NOTE — Assessment & Plan Note (Signed)
6 month follow up has been ordered

## 2016-04-27 ENCOUNTER — Ambulatory Visit: Payer: Self-pay | Admitting: Endocrinology

## 2016-05-02 ENCOUNTER — Ambulatory Visit (INDEPENDENT_AMBULATORY_CARE_PROVIDER_SITE_OTHER): Payer: BLUE CROSS/BLUE SHIELD | Admitting: Endocrinology

## 2016-05-02 ENCOUNTER — Encounter: Payer: Self-pay | Admitting: Endocrinology

## 2016-05-02 VITALS — BP 106/70 | HR 83 | Ht 66.0 in | Wt 147.0 lb

## 2016-05-02 DIAGNOSIS — E041 Nontoxic single thyroid nodule: Secondary | ICD-10-CM

## 2016-05-02 DIAGNOSIS — F32A Depression, unspecified: Secondary | ICD-10-CM

## 2016-05-02 DIAGNOSIS — F329 Major depressive disorder, single episode, unspecified: Secondary | ICD-10-CM | POA: Diagnosis not present

## 2016-05-02 NOTE — Progress Notes (Signed)
Patient ID: Paige [name], female   DOB: Mar 28, 1976, 40 y.o.   MRN: [id number]           Reason for Appointment: Evaluation of thyroid nodule    History of Present Illness:   The patient's thyroid nodule was first discovered on a thyroid ultrasound done because of clinical impression of goiter. The patient had gone to her primary care physician complaining of getting choked on certain foods and was told that she had a thyroid enlargement.   The thyroid ultrasound showed the following Normal thyroid lobes. 4 mm hypoechoic nodule in right upper pole  She was also complaining of fatigue and weight gain and TSH level was checked.  Lab Results  Component Value Date   TSH 1.87 03/18/2016      Medication List       This list is accurate as of: 05/02/16  2:37 PM.  Always use your most recent med list.               ALPRAZolam 0.25 MG tablet  Commonly known as:  XANAX  Take 1 tablet (0.25 mg total) by mouth daily as needed for anxiety or sleep.     omeprazole 40 MG capsule  Commonly known as:  PRILOSEC  Take 1 capsule (40 mg total) by mouth daily.     sertraline 50 MG tablet  Commonly known as:  ZOLOFT  Take 1 tablet (50 mg total) by mouth daily.        Allergies: No Known Allergies  Past Medical History  Diagnosis Date  . Hx: UTI (urinary tract infection)   . Kidney stones     There is no history of radiation to the neck in childhood  Past Surgical History  Procedure Laterality Date  . Tubal ligation  2011    Family History  Problem Relation Age of Onset  . BRCA 1/2 Mother     tested positive for the BRCA Gene  . Breast cancer Maternal Aunt 50  . Breast cancer Maternal Grandmother 37  . Thyroid disease Neg Hx     Social History:  reports that she has never smoked. She does not have any smokeless tobacco history on file. She reports that she does not drink alcohol or use illicit drugs.   Review of Systems:   Review of Systems  Constitutional:  Positive for weight loss.       30 pounds over 2 years  HENT:       She gets choked on certain foods  Respiratory: Positive for daytime sleepiness.   Gastrointestinal: Negative for constipation.  Endocrine: Positive for fatigue, abnormal weight gain and cold intolerance. Negative for general weakness.       Has mild chronic cold intolerance, not worse recently Has decreased libido  Musculoskeletal: Negative for joint pain.  Psychiatric/Behavioral: Positive for depressed mood and insomnia.    She has normal menstrual cycles although heavy in the first 3 days         Examination:   BP 106/70 mmHg  Pulse 83  Ht 5' 6"  (1.676 m)  Wt 147 lb (66.679 kg)  BMI 23.74 kg/m2  SpO2 97%  LMP 04/11/2016   General Appearance:  well-looking        Eyes: No  prominence or swelling of the eyes         THYROID: There is no enlargement of the thyroid. No thyroid nodules felt  Heart sounds normal Lungs clear Abdomen shows no hepatosplenomegaly or other mass.  Reflexes at biceps are normal, ankle reflex difficult to elicit.  Skin: No rash evident Extremities: No edema  Assessment/Plan:  Thyroid nodule: She has an incidental 4 mm thyroid nodule Discussed that nodules less than 10 mm are clinically insignificant and that at least 95% of nodules are benign anyway Also described to the patient that incidental small nodules are common in women. Reassured her that the nodule is of no clinical consequence   She does not have any family History of thyroid or other significant malignancies  Currently she does not need any further evaluation. She can have a follow-up ultrasound done in about a year for completeness through her PCP  DEPRESSION, insomnia and decreased libido: She is not improved with Zoloft and this causes drowsiness. Recommended that she may try Wellbutrin instead.  She will discuss with her PCP  Follow-up in the office as needed   [facility] 05/02/2016

## 2016-06-01 ENCOUNTER — Ambulatory Visit: Payer: Self-pay

## 2016-06-03 ENCOUNTER — Other Ambulatory Visit: Payer: Self-pay | Admitting: Internal Medicine

## 2016-06-03 ENCOUNTER — Ambulatory Visit
Admission: RE | Admit: 2016-06-03 | Discharge: 2016-06-03 | Disposition: A | Discharge status: Home / Self Care | Payer: BLUE CROSS/BLUE SHIELD | Source: Ambulatory Visit | Attending: Internal Medicine | Admitting: Internal Medicine

## 2016-06-03 DIAGNOSIS — R928 Other abnormal and inconclusive findings on diagnostic imaging of breast: Secondary | ICD-10-CM

## 2016-06-03 DIAGNOSIS — N63 Unspecified lump in breast: Secondary | ICD-10-CM | POA: Insufficient documentation

## 2016-10-21 ENCOUNTER — Ambulatory Visit: Payer: Self-pay | Admitting: Internal Medicine

## 2016-10-21 DIAGNOSIS — Z0289 Encounter for other administrative examinations: Secondary | ICD-10-CM

## 2016-11-12 IMAGING — MG MM DIGITAL DIAGNOSTIC BILAT W/ TOMO W/ CAD
8 of 13 series · 8 of 29 positions shown · non-contrast
Comparison: Previous exam(s).

CLINICAL DATA: Followup for probably benign right breast masses.

EXAM:
2D DIGITAL DIAGNOSTIC BILATERAL MAMMOGRAM WITH CAD AND ADJUNCT TOMO
RIGHT BREAST ULTRASOUND

[R MLO (1 of 2)]
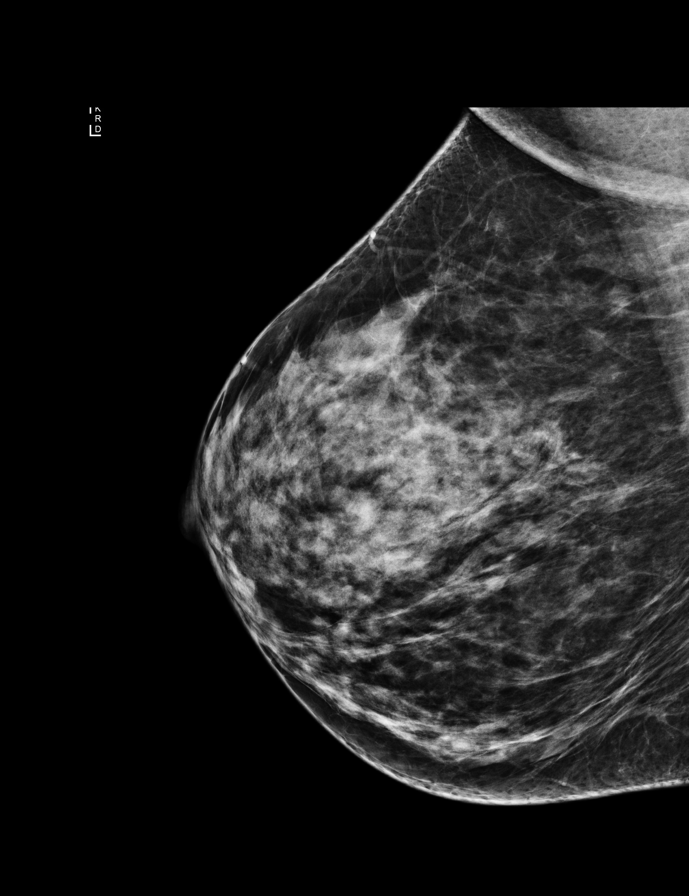

[R MLO (2 of 2)]
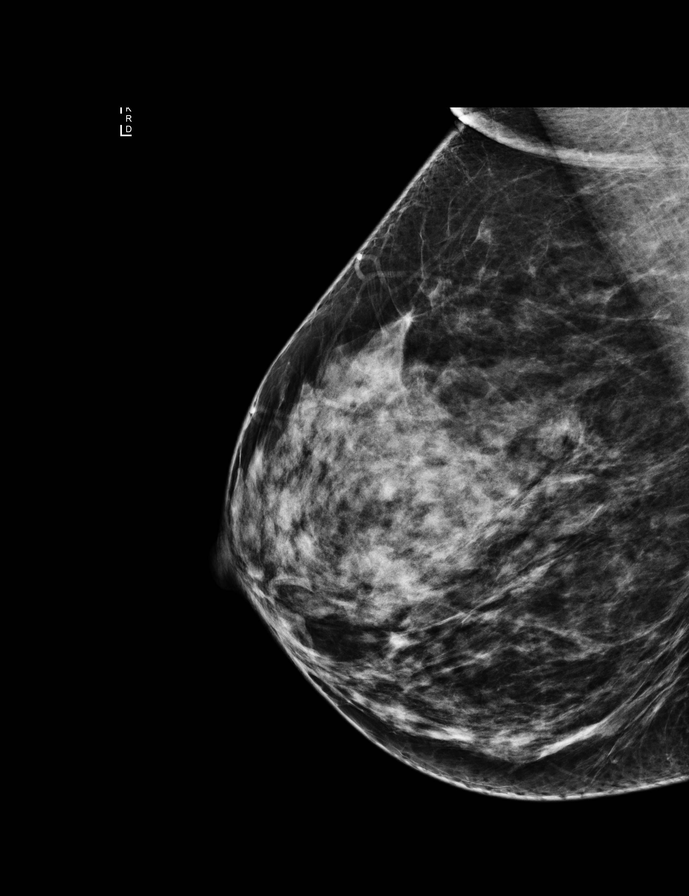

[R MLO synth-2D]
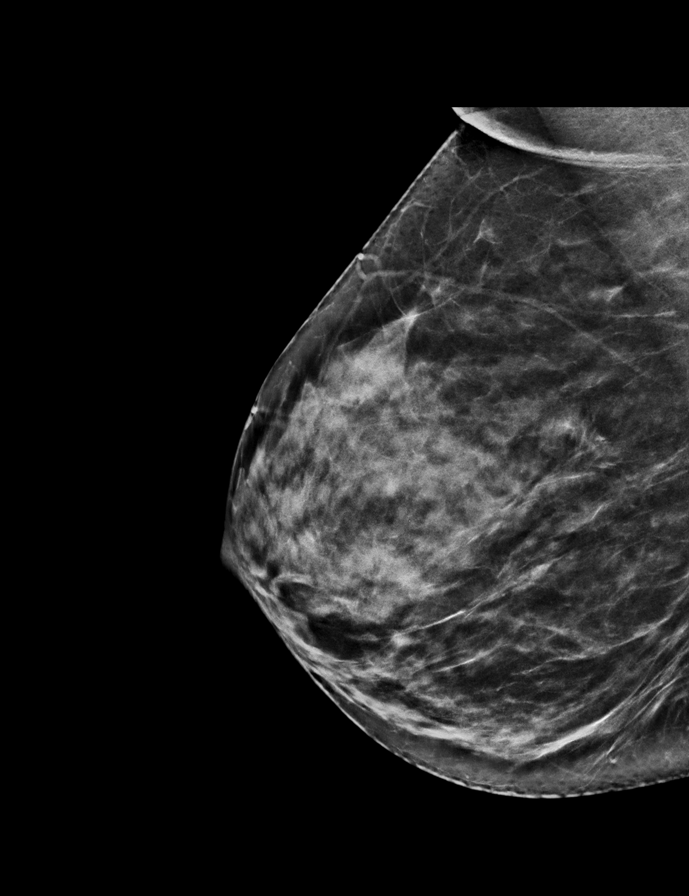

[L MLO]
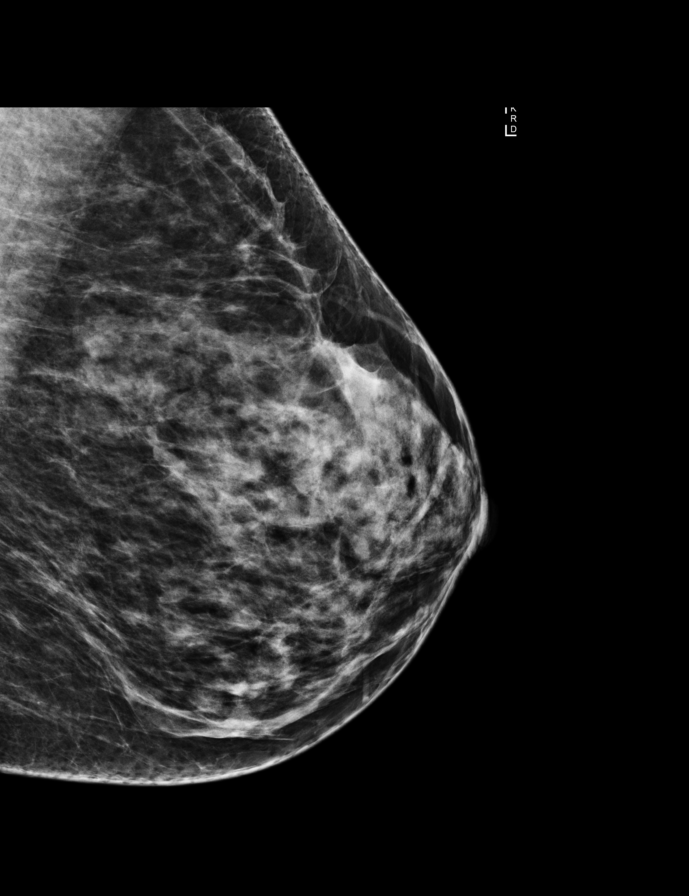

[R CC]
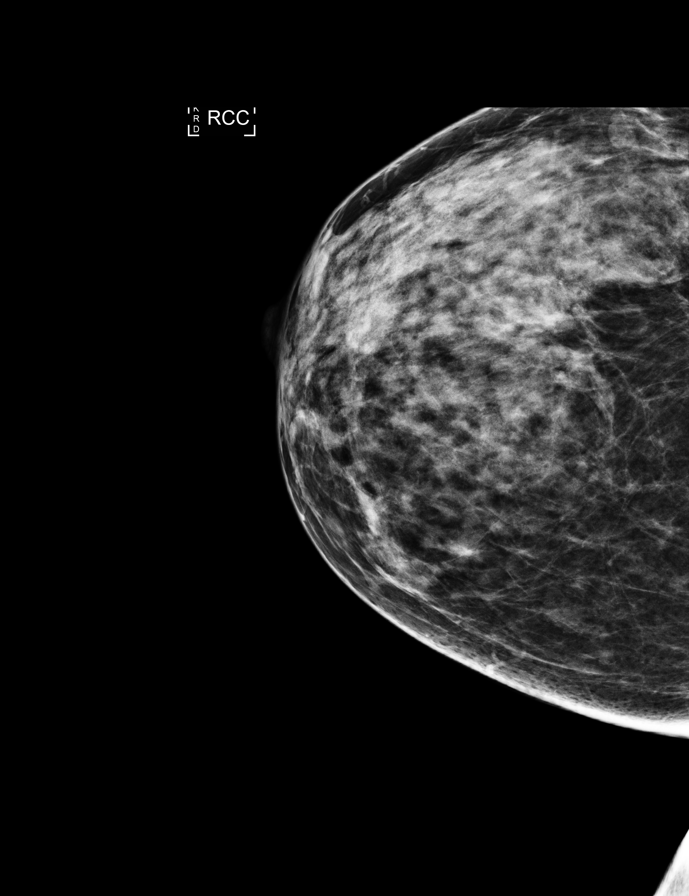

[L CC synth-2D]
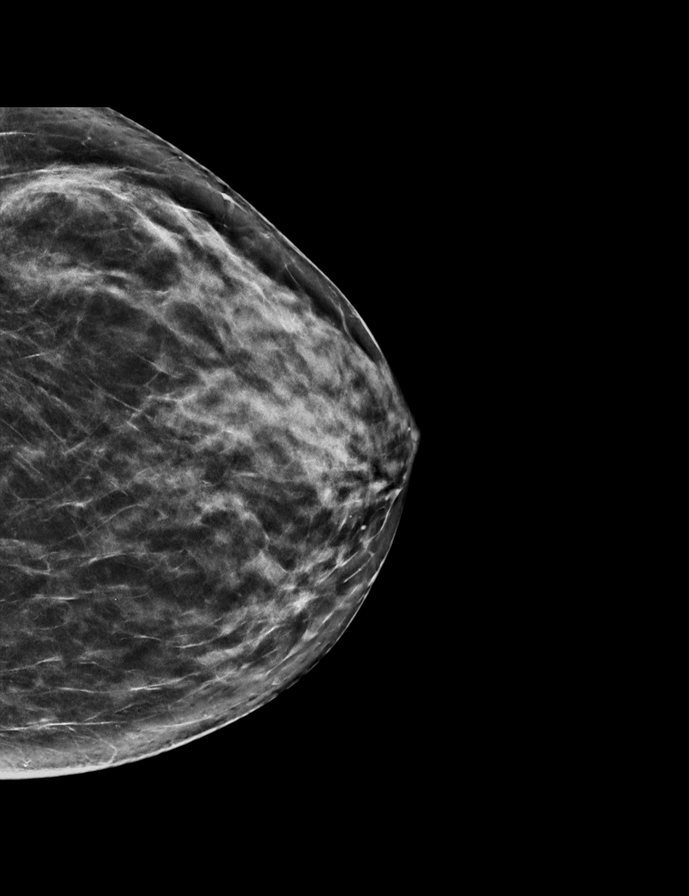

[L MLO synth-2D]
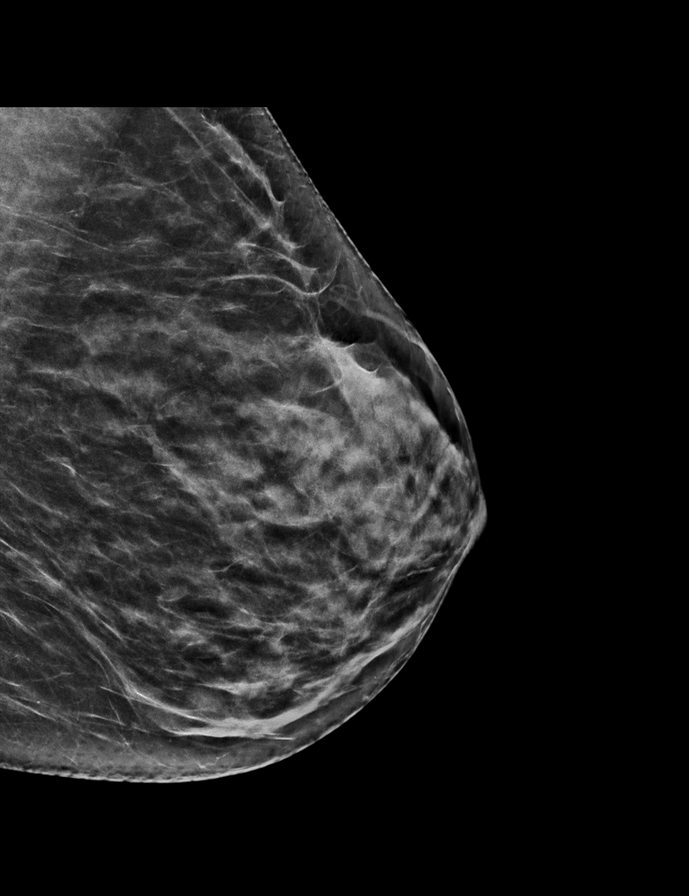

[L CC]
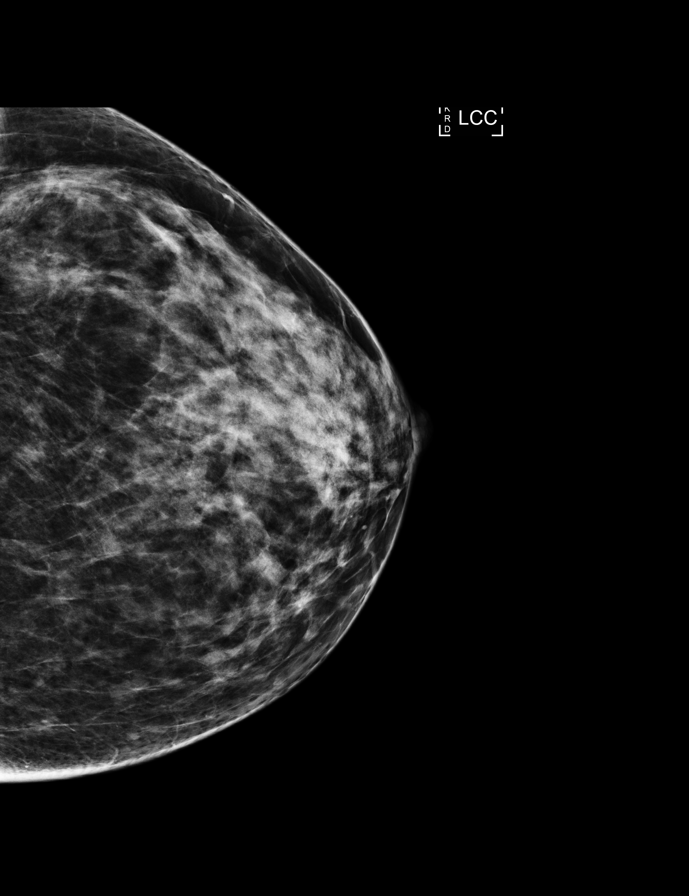

[8 of 29 positions shown; findings below may reference images not displayed]

ACR Breast Density Category c: The breast tissue is heterogeneously
dense, which may obscure small masses.
FINDINGS: No suspicious masses or calcifications are is in either breast.
There is no mammographic evidence of malignancy in either breast.
The oval circumscribed mass in the outer right breast appears
unchanged.

Mammographic images were processed with CAD.

Targeted ultrasound of the right breast was performed demonstrating
an unchanged cluster of cysts at 9 o'clock 8 cm from nipple
measuring 0.6 x 0.4 x 0.8 cm, unchanged in size and appearance. The
circumscribed anechoic mass with posterior acoustic enhancement in
the right breast at [DATE] 2 cm from the nipple measures 0.6 x 0.5 by
0.6 cm, unchanged and the adjacent similar appearing mass measures
0.5 x 0.3x 0.4 cm. These 2 masses are unchanged and demonstrate
imaging features consistent with small cysts although are too deep
in location to fully characterize.
IMPRESSION: 1.  No mammographic evidence of malignancy in either breast.

2.  Unchanged probably benign masses at [DATE] in the right breast.

RECOMMENDATION:
Bilateral diagnostic mammography with right breast ultrasound in 12
months which will demonstrate 2 years of stability of the probably
benign right breast masses.

I have discussed the findings and recommendations with the patient.
Results were also provided in writing at the conclusion of the
visit. If applicable, a reminder letter will be sent to the patient
regarding the next appointment.

BI-RADS CATEGORY  3: Probably benign.

## 2016-11-13 ENCOUNTER — Other Ambulatory Visit: Payer: Self-pay | Admitting: Internal Medicine

## 2016-11-14 NOTE — Telephone Encounter (Signed)
Pt last refill of Prilosec was on 08/12/16. Pt last OV was on 04/18/16 and last labs were on 03/18/16. Pt has no future scheduled appts.

## 2017-03-29 ENCOUNTER — Encounter: Payer: Self-pay | Admitting: Internal Medicine

## 2017-03-29 ENCOUNTER — Ambulatory Visit (INDEPENDENT_AMBULATORY_CARE_PROVIDER_SITE_OTHER): Payer: Self-pay | Admitting: Internal Medicine

## 2017-03-29 DIAGNOSIS — F411 Generalized anxiety disorder: Secondary | ICD-10-CM

## 2017-03-29 DIAGNOSIS — E041 Nontoxic single thyroid nodule: Secondary | ICD-10-CM

## 2017-03-29 DIAGNOSIS — J301 Allergic rhinitis due to pollen: Secondary | ICD-10-CM

## 2017-03-29 MED ORDER — OMEPRAZOLE 40 MG PO CPDR: 40.0000 mg | DELAYED_RELEASE_CAPSULE | Freq: Every day | ORAL | 2 refills | Status: DC

## 2017-03-29 MED ORDER — BUPROPION HCL ER (SR) 150 MG PO TB12: 150.0000 mg | ORAL_TABLET | Freq: Two times a day (BID) | ORAL | 3 refills | Status: DC

## 2017-03-29 MED ORDER — BENZONATATE 200 MG PO CAPS: 200.0000 mg | ORAL_CAPSULE | Freq: Three times a day (TID) | ORAL | 1 refills | Status: DC | PRN

## 2017-03-29 MED ORDER — ALPRAZOLAM 0.25 MG PO TABS: 0.2500 mg | ORAL_TABLET | Freq: Every day | ORAL | 5 refills | Status: DC | PRN

## 2017-03-29 MED ORDER — PREDNISONE 10 MG PO TABS: ORAL_TABLET | ORAL | 0 refills | Status: DC

## 2017-03-29 NOTE — Progress Notes (Signed)
Subjective:  Patient ID: Paige [name], female    DOB: [birth year]  Age: 41 y.o. MRN: [id number]  CC: Diagnoses of Anxiety state, Thyroid nodule, and Seasonal allergic rhinitis due to pollen were pertinent to this visit.  HPI Ayzia Day presents for follow up and management of multiple issues:  1) cough congestion,,  Sneezing,  Started 5 days ago  3) weight gain  Has gained 8 lbs since last July  bmi 25.  Still taking zoloft.  Discussed wellbutrin   3( thyroid nodule  4 mm .Marland Kitchen One year follow up US recommend d by endocrine  Last year   Due I late jyuly    Needs annual follow on right breats with bilateral diag and [place] in late august   Outpatient Medications Prior to Visit  Medication Sig Dispense Refill  . ALPRAZolam (XANAX) 0.25 MG tablet Take 1 tablet (0.25 mg total) by mouth daily as needed for anxiety or sleep. 30 tablet 5  . omeprazole (PRILOSEC) 40 MG capsule TAKE 1 CAPSULE (40 MG TOTAL) BY MOUTH DAILY. 90 capsule 1  . sertraline (ZOLOFT) 50 MG tablet Take 1 tablet (50 mg total) by mouth daily. (Patient not taking: Reported on 03/29/2017) 30 tablet 3   No facility-administered medications prior to visit.     Review of Systems;  Patient denies headache, fevers, malaise, unintentional weight loss, skin rash, eye pain,agia,  hemoptysis , cough, dyspnea, wheezing, chest pain, palpitations, orthopnea, edema, abdominal pain, nausea, melena, diarrhea, constipation, flank pain, dysuria, hematuria, urinary  Frequency, nocturia, numbness, tingling, seizures,  Focal weakness, Loss of consciousness,  Tremor, insomnia, And suicidal ideation.      Objective:  BP 114/66 (BP Location: Left Arm, Patient Position: Sitting, Cuff Size: Normal)   Pulse 87   Temp 98 F (36.7 C) (Oral)   Resp 16   Ht 5\' 6"  (1.676 m)   Wt 155 lb 9.6 oz (70.6 kg)   SpO2 98%   BMI 25.11 kg/m   BP Readings from Last 3 Encounters:  03/29/17 114/66  05/02/16 106/70  04/21/16 104/78    Wt Readings from Last  3 Encounters:  03/29/17 155 lb 9.6 oz (70.6 kg)  05/02/16 147 lb (66.7 kg)  04/21/16 151 lb (68.5 kg)    General appearance: alert, cooperative and appears stated age Ears: normal TM's and external ear canals both ears Throat: lips, mucosa, and tongue normal; teeth and gums normal Neck: no adenopathy, no carotid bruit, supple, symmetrical, trachea midline and thyroid not enlarged, symmetric, no tenderness/mass/nodules Back: symmetric, no curvature. ROM normal. No CVA tenderness. Lungs: clear to auscultation bilaterally Heart: regular rate and rhythm, S1, S2 normal, no murmur, click, rub or gallop Abdomen: soft, non-tender; bowel sounds normal; no masses,  no organomegaly Pulses: 2+ and symmetric Skin: Skin color, texture, turgor normal. No rashes or lesions Lymph nodes: Cervical, supraclavicular, and axillary nodes normal.  No results found for: HGBA1C  Lab Results  Component Value Date   CREATININE 0.90 03/18/2016   CREATININE 1.03 11/17/2013   CREATININE 0.91 08/25/2010    Lab Results  Component Value Date   WBC 8.9 03/18/2016   HGB 13.9 03/18/2016   HCT 42.0 03/18/2016   PLT 353.0 03/18/2016   GLUCOSE 72 03/18/2016   ALT 14 03/18/2016   AST 18 03/18/2016   NA 140 03/18/2016   K 3.7 03/18/2016   CL 104 03/18/2016   CREATININE 0.90 03/18/2016   BUN 9 03/18/2016   CO2 27 03/18/2016   TSH 1.87 03/18/2016  US Breast Ltd Uni Right Inc Axilla  Result Date: 06/03/2016 CLINICAL DATA:  Followup for probably benign right breast masses. EXAM: 2D DIGITAL DIAGNOSTIC BILATERAL MAMMOGRAM WITH CAD AND ADJUNCT TOMO RIGHT BREAST ULTRASOUND COMPARISON:  Previous exam(s). ACR Breast Density Category c: The breast tissue is heterogeneously dense, which may obscure small masses. FINDINGS: No suspicious masses or calcifications are is in either breast. There is no mammographic evidence of malignancy in either breast. The oval circumscribed mass in the outer right breast appears unchanged.  Mammographic images were processed with CAD. Targeted ultrasound of the right breast was performed demonstrating an unchanged cluster of cysts at 9 o'clock 8 cm from nipple measuring 0.6 x 0.4 x 0.8 cm, unchanged in size and appearance. The circumscribed anechoic mass with posterior acoustic enhancement in the right breast at 10:30 2 cm from the nipple measures 0.6 x 0.5 by 0.6 cm, unchanged and the adjacent similar appearing mass measures 0.5 x 0.3x 0.4 cm. These 2 masses are unchanged and demonstrate imaging features consistent with small cysts although are too deep in location to fully characterize. IMPRESSION: 1.  No mammographic evidence of malignancy in either breast. 2.  Unchanged probably benign masses at 10:30 in the right breast. RECOMMENDATION: Bilateral diagnostic mammography with right breast ultrasound in 12 months which will demonstrate 2 years of stability of the probably benign right breast masses. I have discussed the findings and recommendations with the patient. Results were also provided in writing at the conclusion of the visit. If applicable, a reminder letter will be sent to the patient regarding the next appointment. BI-RADS CATEGORY  3: Probably benign. Electronically Signed   By: Edwin Cap M.D.   On: 06/03/2016 14:11   Mm Diag Breast Tomo Bilateral  Result Date: 06/03/2016 CLINICAL DATA:  Followup for probably benign right breast masses. EXAM: 2D DIGITAL DIAGNOSTIC BILATERAL MAMMOGRAM WITH CAD AND ADJUNCT TOMO RIGHT BREAST ULTRASOUND COMPARISON:  Previous exam(s). ACR Breast Density Category c: The breast tissue is heterogeneously dense, which may obscure small masses. FINDINGS: No suspicious masses or calcifications are is in either breast. There is no mammographic evidence of malignancy in either breast. The oval circumscribed mass in the outer right breast appears unchanged. Mammographic images were processed with CAD. Targeted ultrasound of the right breast was performed  demonstrating an unchanged cluster of cysts at 9 o'clock 8 cm from nipple measuring 0.6 x 0.4 x 0.8 cm, unchanged in size and appearance. The circumscribed anechoic mass with posterior acoustic enhancement in the right breast at 10:30 2 cm from the nipple measures 0.6 x 0.5 by 0.6 cm, unchanged and the adjacent similar appearing mass measures 0.5 x 0.3x 0.4 cm. These 2 masses are unchanged and demonstrate imaging features consistent with small cysts although are too deep in location to fully characterize. IMPRESSION: 1.  No mammographic evidence of malignancy in either breast. 2.  Unchanged probably benign masses at 10:30 in the right breast. RECOMMENDATION: Bilateral diagnostic mammography with right breast ultrasound in 12 months which will demonstrate 2 years of stability of the probably benign right breast masses. I have discussed the findings and recommendations with the patient. Results were also provided in writing at the conclusion of the visit. If applicable, a reminder letter will be sent to the patient regarding the next appointment. BI-RADS CATEGORY  3: Probably benign. Electronically Signed   By: Edwin Cap M.D.   On: 06/03/2016 14:11    Assessment & Plan:   Problem List Items Addressed This Visit  Thyroid nodule    1 year follow up ultrasound ordered       Anxiety state    She has had weight gain with zoloft and currently her symptoms are less anxiety and more suggestive of depressio and concentration deficit .  Trial of  wellbutrin , weaning zoloft off      Relevant Medications   buPROPion (WELLBUTRIN SR) 150 MG 12 hr tablet   ALPRAZolam (XANAX) 0.25 MG tablet   Allergic rhinitis due to pollen    HEENT EXAM IS NORMAL.  prednisonee taper, allegra         A total of 25 minutes of face to face time was spent with patient more than half of which was spent in counselling about the above mentioned conditions  and coordination of care  I have discontinued Ms. Nienhuis's  sertraline. I am also having her start on predniSONE, buPROPion, and benzonatate. Additionally, I am having her maintain her ALPRAZolam and omeprazole.  Meds ordered this encounter  Medications  . predniSONE (DELTASONE) 10 MG tablet    Sig: 6 tablets on Day 1 , then reduce by 1 tablet daily until gone    Dispense:  21 tablet    Refill:  0  . buPROPion (WELLBUTRIN SR) 150 MG 12 hr tablet    Sig: Take 1 tablet (150 mg total) by mouth 2 (two) times daily.    Dispense:  60 tablet    Refill:  3  . ALPRAZolam (XANAX) 0.25 MG tablet    Sig: Take 1 tablet (0.25 mg total) by mouth daily as needed for anxiety or sleep.    Dispense:  30 tablet    Refill:  5  . omeprazole (PRILOSEC) 40 MG capsule    Sig: Take 1 capsule (40 mg total) by mouth daily.    Dispense:  90 capsule    Refill:  2  . benzonatate (TESSALON) 200 MG capsule    Sig: Take 1 capsule (200 mg total) by mouth 3 (three) times daily as needed for cough.    Dispense:  60 capsule    Refill:  1    Medications Discontinued During This Encounter  Medication Reason  . sertraline (ZOLOFT) 50 MG tablet Patient has not taken in last 30 days  . ALPRAZolam (XANAX) 0.25 MG tablet Reorder  . omeprazole (PRILOSEC) 40 MG capsule Reorder    Follow-up: No Follow-up on file.   Sherlene ShamsULLO, Jilian West L, MD

## 2017-03-29 NOTE — Patient Instructions (Addendum)
We discussed changing therapy for your depression/anxiety .  Trial of wellbutrin twice daily .  Take the second dose by 2 or 3 pm ro avoid insomnia  Continue daily zoloft for one week ,  Then take it  every other Other day for one week,  Then stop .  I have refilled the alprazolam for use at night if needed    We discussed the growing concern about long term use of Prilosec in light of the recently published studies suggesting an association with increased risk of dementia and kidney failure.  I advised  you to try using  Generic Pepcid (famotidine 20 mg)  nce or o twice daily.  If your reflux symptoms are controlled,  You can Continue this medication until it is not  And then resume the prilosec for 2 weeks,  Then continue alternating    For your allergies:   Prednisone  taper for 6 days  Start using allegra or allegra D daily to prevent recurrence .  Tessalon perles (capsules ) for the cough

## 2017-03-30 DIAGNOSIS — J301 Allergic rhinitis due to pollen: Secondary | ICD-10-CM | POA: Insufficient documentation

## 2017-03-30 NOTE — Assessment & Plan Note (Addendum)
She has had weight gain with zoloft and currently her symptoms are less anxiety and more suggestive of depressio and concentration deficit .  Trial of  wellbutrin , weaning zoloft off

## 2017-03-30 NOTE — Assessment & Plan Note (Signed)
1 year follow up ultrasound ordered

## 2017-03-30 NOTE — Assessment & Plan Note (Signed)
HEENT EXAM IS NORMAL.  prednisonee taper, allegra

## 2017-04-07 ENCOUNTER — Encounter: Payer: Self-pay | Admitting: Internal Medicine

## 2017-04-07 DIAGNOSIS — M79605 Pain in left leg: Principal | ICD-10-CM

## 2017-04-07 DIAGNOSIS — M79604 Pain in right leg: Secondary | ICD-10-CM

## 2017-04-11 ENCOUNTER — Other Ambulatory Visit (INDEPENDENT_AMBULATORY_CARE_PROVIDER_SITE_OTHER): Payer: Self-pay

## 2017-04-11 DIAGNOSIS — M79605 Pain in left leg: Secondary | ICD-10-CM

## 2017-04-11 DIAGNOSIS — M79604 Pain in right leg: Secondary | ICD-10-CM

## 2017-04-11 LAB — COMPREHENSIVE METABOLIC PANEL
ALK PHOS: 54 U/L (ref 39–117)
ALT: 12 U/L (ref 0–35)
AST: 15 U/L (ref 0–37)
Albumin: 4.2 g/dL (ref 3.5–5.2)
BUN: 18 mg/dL (ref 6–23)
CALCIUM: 9.1 mg/dL (ref 8.4–10.5)
CO2: 25 meq/L (ref 19–32)
Chloride: 105 mEq/L (ref 96–112)
Creatinine, Ser: 0.81 mg/dL (ref 0.40–1.20)
GFR: 82.11 mL/min (ref >60.00)
GLUCOSE: 82 mg/dL (ref 70–99)
POTASSIUM: 3.6 meq/L (ref 3.5–5.1)
Sodium: 138 mEq/L (ref 135–145)
TOTAL PROTEIN: 6.5 g/dL (ref 6.0–8.3)
Total Bilirubin: 0.3 mg/dL (ref 0.2–1.2)

## 2017-04-11 LAB — CBC WITH DIFFERENTIAL/PLATELET
Basophils Absolute: 0.1 10*3/uL (ref 0.0–0.1)
Basophils Relative: 0.6 % (ref 0.0–3.0)
EOS PCT: 7.5 % — AB (ref 0.0–5.0)
Eosinophils Absolute: 0.7 10*3/uL (ref 0.0–0.7)
HCT: 38.6 % (ref 36.0–46.0)
Hemoglobin: 12.8 g/dL (ref 12.0–15.0)
LYMPHS ABS: 1.9 10*3/uL (ref 0.7–4.0)
Lymphocytes Relative: 21.5 % (ref 12.0–46.0)
MCHC: 33.2 g/dL (ref 30.0–36.0)
MCV: 85.6 fl (ref 78.0–100.0)
MONOS PCT: 7.9 % (ref 3.0–12.0)
Monocytes Absolute: 0.7 10*3/uL (ref 0.1–1.0)
NEUTROS ABS: 5.5 10*3/uL (ref 1.4–7.7)
NEUTROS PCT: 62.5 % (ref 43.0–77.0)
PLATELETS: 337 10*3/uL (ref 150.0–400.0)
RBC: 4.51 Mil/uL (ref 3.87–5.11)
RDW: 13.2 % (ref 11.5–15.5)
WBC: 8.8 10*3/uL (ref 4.0–10.5)

## 2017-04-11 LAB — C-REACTIVE PROTEIN: CRP: 1.9 mg/dL (ref 0.5–20.0)

## 2017-04-11 LAB — SEDIMENTATION RATE: Sed Rate: 21 mm/hr — ABNORMAL HIGH (ref 0–20)

## 2017-04-11 LAB — VITAMIN D 25 HYDROXY (VIT D DEFICIENCY, FRACTURES): VITD: 36.87 ng/mL (ref 30.00–100.00)

## 2017-04-11 LAB — FERRITIN: Ferritin: 27.6 ng/mL (ref 10.0–291.0)

## 2017-04-12 LAB — IRON AND TIBC
%SAT: 31 % (ref 11–50)
IRON: 87 ug/dL (ref 40–190)
TIBC: 281 ug/dL (ref 250–450)
UIBC: 194 ug/dL

## 2017-04-13 ENCOUNTER — Encounter: Payer: Self-pay | Admitting: Internal Medicine

## 2017-04-13 ENCOUNTER — Telehealth: Payer: Self-pay | Admitting: Internal Medicine

## 2017-04-13 NOTE — Telephone Encounter (Signed)
Pt called requesting lab results. Please advise, thank you!  Call pt @ [phone]

## 2017-04-13 NOTE — Telephone Encounter (Signed)
Please advise 

## 2017-05-25 ENCOUNTER — Other Ambulatory Visit: Payer: Self-pay | Admitting: Internal Medicine

## 2017-05-25 ENCOUNTER — Encounter: Payer: Self-pay | Admitting: Internal Medicine

## 2017-05-25 DIAGNOSIS — E041 Nontoxic single thyroid nodule: Secondary | ICD-10-CM

## 2017-05-25 MED ORDER — SERTRALINE HCL 50 MG PO TABS: 50.0000 mg | ORAL_TABLET | Freq: Every day | ORAL | 3 refills | Status: DC

## 2017-05-29 ENCOUNTER — Ambulatory Visit (INDEPENDENT_AMBULATORY_CARE_PROVIDER_SITE_OTHER): Payer: Self-pay | Admitting: Internal Medicine

## 2017-05-29 ENCOUNTER — Encounter: Payer: Self-pay | Admitting: Internal Medicine

## 2017-05-29 ENCOUNTER — Ambulatory Visit
Admission: RE | Admit: 2017-05-29 | Discharge: 2017-05-29 | Disposition: A | Discharge status: Home / Self Care | Payer: Self-pay | Source: Ambulatory Visit | Attending: Internal Medicine | Admitting: Internal Medicine

## 2017-05-29 VITALS — BP 114/78 | HR 99 | Temp 98.2°F | Resp 15 | Ht 66.0 in | Wt 158.8 lb

## 2017-05-29 DIAGNOSIS — F411 Generalized anxiety disorder: Secondary | ICD-10-CM

## 2017-05-29 DIAGNOSIS — R41840 Attention and concentration deficit: Secondary | ICD-10-CM

## 2017-05-29 DIAGNOSIS — E041 Nontoxic single thyroid nodule: Secondary | ICD-10-CM

## 2017-05-29 DIAGNOSIS — R635 Abnormal weight gain: Secondary | ICD-10-CM

## 2017-05-29 MED ORDER — CITALOPRAM HYDROBROMIDE 10 MG PO TABS: 10.0000 mg | ORAL_TABLET | Freq: Every day | ORAL | 1 refills | Status: DC

## 2017-05-29 NOTE — Progress Notes (Addendum)
Subjective:  Patient ID: Paige [name], female    DOB: [birth year]  Age: 41 y.o. MRN: [id number]  CC: The primary encounter diagnosis was Weight gain. Diagnoses of Concentration deficit, Anxiety state, and Thyroid nodule were also pertinent to this visit.  HPI Ritisha L Dedmon presents for medication management,  Not tolerating wellbutrin due to  Recurrent daily headaches .  Stopped it several weeks ago. Symptoms still present,  gets agitated easily , not able to control temper,  Yelling at her children  Husband Jillyn HiddenGary also has ADHD and son is in therapy.  She is still gaining weight.     needs tsh   Wants alternative therapy,  Friend has had good results with abilify but has history of bipolar disorder.     Outpatient Medications Prior to Visit  Medication Sig Dispense Refill  . ALPRAZolam (XANAX) 0.25 MG tablet Take 1 tablet (0.25 mg total) by mouth daily as needed for anxiety or sleep. 30 tablet 5  . omeprazole (PRILOSEC) 40 MG capsule Take 1 capsule (40 mg total) by mouth daily. 90 capsule 2  . buPROPion (WELLBUTRIN SR) 150 MG 12 hr tablet Take 1 tablet (150 mg total) by mouth 2 (two) times daily. 60 tablet 3  . sertraline (ZOLOFT) 50 MG tablet Take 1 tablet (50 mg total) by mouth daily. 30 tablet 3  . benzonatate (TESSALON) 200 MG capsule Take 1 capsule (200 mg total) by mouth 3 (three) times daily as needed for cough. (Patient not taking: Reported on 05/29/2017) 60 capsule 1  . predniSONE (DELTASONE) 10 MG tablet 6 tablets on Day 1 , then reduce by 1 tablet daily until gone (Patient not taking: Reported on 05/29/2017) 21 tablet 0   No facility-administered medications prior to visit.     Review of Systems;  Patient denies headache, fevers, malaise, unintentional weight loss, skin rash, eye pain, sinus congestion and sinus pain, sore throat, dysphagia,  hemoptysis , cough, dyspnea, wheezing, chest pain, palpitations, orthopnea, edema, abdominal pain, nausea, melena, diarrhea,  constipation, flank pain, dysuria, hematuria, urinary  Frequency, nocturia, numbness, tingling, seizures,  Focal weakness, Loss of consciousness,  Tremor, insomnia, depression, anxiety, and suicidal ideation.      Objective:  BP 114/78 (BP Location: Left Arm, Patient Position: Sitting, Cuff Size: Normal)   Pulse 99   Temp 98.2 F (36.8 C) (Oral)   Resp 15   Ht 5\' 6"  (1.676 m)   Wt 158 lb 12.8 oz (72 kg)   SpO2 98%   BMI 25.63 kg/m   BP Readings from Last 3 Encounters:  05/29/17 114/78  03/29/17 114/66  05/02/16 106/70    Wt Readings from Last 3 Encounters:  05/29/17 158 lb 12.8 oz (72 kg)  03/29/17 155 lb 9.6 oz (70.6 kg)  05/02/16 147 lb (66.7 kg)    General appearance: alert, cooperative and appears stated age Ears: normal TM's and external ear canals both ears Throat: lips, mucosa, and tongue normal; teeth and gums normal Neck: no adenopathy, no carotid bruit, supple, symmetrical, trachea midline and thyroid not enlarged, symmetric, no tenderness/mass/nodules Back: symmetric, no curvature. ROM normal. No CVA tenderness. Lungs: clear to auscultation bilaterally Heart: regular rate and rhythm, S1, S2 normal, no murmur, click, rub or gallop Abdomen: soft, non-tender; bowel sounds normal; no masses,  no organomegaly Pulses: 2+ and symmetric Skin: Skin color, texture, turgor normal. No rashes or lesions Lymph nodes: Cervical, supraclavicular, and axillary nodes normal.  No results found for: HGBA1C  Lab Results  Component  Value Date   CREATININE 0.81 04/11/2017   CREATININE 0.90 03/18/2016   CREATININE 1.03 11/17/2013    Lab Results  Component Value Date   WBC 8.8 04/11/2017   HGB 12.8 04/11/2017   HCT 38.6 04/11/2017   PLT 337.0 04/11/2017   GLUCOSE 82 04/11/2017   ALT 12 04/11/2017   AST 15 04/11/2017   NA 138 04/11/2017   K 3.6 04/11/2017   CL 105 04/11/2017   CREATININE 0.81 04/11/2017   BUN 18 04/11/2017   CO2 25 04/11/2017   TSH 0.78 05/29/2017     US Soft Tissue Head/neck  Result Date: 05/29/2017 CLINICAL DATA:  Prior ultrasound follow-up. EXAM: THYROID ULTRASOUND TECHNIQUE: Ultrasound examination of the thyroid gland and adjacent soft tissues was performed. COMPARISON:  03/25/2016 FINDINGS: Parenchymal Echotexture: Normal Isthmus: 0.2 cm, previously 0.2 cm Right lobe: 4.2 x 1.2 x 1.0 cm, previously 4.3 x 1.4 x 1.2 cm Left lobe: 3.9 x 1.3 x 1.0 cm, previously 4.0 x 1.3 x 1.0 cm _________________________________________________________ Estimated total number of nodules >/= 1 cm: 0 Number of spongiform nodules >/=  2 cm not described below (TR1): 0 Number of mixed cystic and solid nodules >/= 1.5 cm not described below (TR2): 0 _________________________________________________________ 0.5 cm hypoechoic nodule in the right lobe does not meet criteria for biopsy or follow-up. IMPRESSION: Small right upper pole nodule does not meet criteria for biopsy for follow-up. No other abnormality. The above is in keeping with the ACR TI-RADS recommendations - J Am Coll Radiol 2017;14:587-595. Electronically Signed   By: Jolaine Click M.D.   On: 05/29/2017 15:59    Assessment & Plan:   Problem List Items Addressed This Visit    Thyroid nodule    Repeat ultrasound shows minimal change to nodule,  Still too small to biopsy or followup.    Lab Results  Component Value Date   TSH 0.78 05/29/2017         Concentration deficit    Did not tolerate wellbutrin due to increased headaches. Referral to [facility] for cognitive testing.       Relevant Orders   Ambulatory referral to Psychology   Anxiety state    wellbutirn stopped. celexa started,  will add ability vs lamictal pending response to celexa.       Relevant Medications   citalopram (CELEXA) 10 MG tablet    Other Visit Diagnoses    Weight gain    -  Primary   Relevant Orders   TSH (Completed)     A total of 25 minutes of face to face time was spent with patient more than half of which  was spent in counselling about the above mentioned conditions  and coordination of care  I have discontinued Ms. Varas's predniSONE, buPROPion, benzonatate, and sertraline. I am also having her start on citalopram. Additionally, I am having her maintain her ALPRAZolam and omeprazole.  Meds ordered this encounter  Medications  . citalopram (CELEXA) 10 MG tablet    Sig: Take 1 tablet (10 mg total) by mouth daily.    Dispense:  90 tablet    Refill:  1    Medications Discontinued During This Encounter  Medication Reason  . benzonatate (TESSALON) 200 MG capsule Patient has not taken in last 30 days  . predniSONE (DELTASONE) 10 MG tablet Therapy course completed  . sertraline (ZOLOFT) 50 MG tablet   . buPROPion (WELLBUTRIN SR) 150 MG 12 hr tablet     Follow-up: No Follow-up on file.  Crecencio Mc, MD

## 2017-05-29 NOTE — Patient Instructions (Addendum)
starting citalopram at 1/2 tablet daily with dinner  Increase to full tablet after 4 days if tolerating  Can increase dose to 20 mg after two weeks,  Or can start abilify if the 10 mg dose is sedating   Referral to [facility] for cognitive  testing (to determine if you have a concentration disorder like ADD)

## 2017-05-30 ENCOUNTER — Encounter: Payer: Self-pay | Admitting: Internal Medicine

## 2017-05-30 LAB — TSH: TSH: 0.78 m[IU]/L

## 2017-05-30 NOTE — Assessment & Plan Note (Signed)
Repeat ultrasound shows minimal change to nodule,  Still too small to biopsy or followup.    Lab Results  Component Value Date   TSH 0.78 05/29/2017

## 2017-05-30 NOTE — Assessment & Plan Note (Signed)
wellbutirn stopped. celexa started,  will add ability vs lamictal pending response to celexa.

## 2017-05-30 NOTE — Assessment & Plan Note (Signed)
Did not tolerate wellbutrin due to increased headaches. Referral to [facility] for cognitive testing.

## 2017-06-07 ENCOUNTER — Ambulatory Visit: Payer: Self-pay | Attending: Oncology

## 2017-06-07 ENCOUNTER — Ambulatory Visit
Admission: RE | Admit: 2017-06-07 | Discharge: 2017-06-07 | Disposition: A | Discharge status: Home / Self Care | Payer: Self-pay | Source: Ambulatory Visit | Attending: Oncology | Admitting: Oncology

## 2017-06-07 ENCOUNTER — Other Ambulatory Visit: Payer: Self-pay

## 2017-06-07 ENCOUNTER — Telehealth: Payer: Self-pay | Admitting: *Deleted

## 2017-06-07 DIAGNOSIS — N63 Unspecified lump in unspecified breast: Secondary | ICD-10-CM

## 2017-06-07 DIAGNOSIS — R928 Other abnormal and inconclusive findings on diagnostic imaging of breast: Secondary | ICD-10-CM

## 2017-06-07 NOTE — Telephone Encounter (Signed)
[facility] has requested orders for bilateral diagnostic and bilateral ultra sound .This will be for her annual , right breast for stability.  Contact [phone]

## 2017-06-08 NOTE — Telephone Encounter (Signed)
ordered

## 2017-06-08 NOTE — Telephone Encounter (Signed)
Please advise 

## 2017-06-12 ENCOUNTER — Other Ambulatory Visit: Payer: Self-pay | Admitting: Internal Medicine

## 2017-07-26 ENCOUNTER — Encounter: Payer: Self-pay | Admitting: Internal Medicine

## 2017-07-26 ENCOUNTER — Other Ambulatory Visit: Payer: Self-pay | Admitting: Internal Medicine

## 2017-07-26 MED ORDER — CITALOPRAM HYDROBROMIDE 20 MG PO TABS: 20.0000 mg | ORAL_TABLET | Freq: Every day | ORAL | 5 refills | Status: DC

## 2017-12-28 ENCOUNTER — Telehealth: Payer: Self-pay | Admitting: *Deleted

## 2017-12-28 NOTE — Telephone Encounter (Signed)
Copied from CRM [id number]. Topic: Appointment Scheduling - Scheduling Inquiry for Clinic >> Dec 28, 2017  2:36 PM [name], Yaneli Keithley J wrote: Reason for CRM:  Pt having on and off abdominal pain, severe pain all over, but more so in her legs.  Pt states the pain comes and goes, but is worse at times.  Pt doesn't know if it is stress, she has had stress in her life recently Pt states this has been going on 7 months or more.  Dr Darrick Huntsmanullo did labs last visit (8/18).  Pt states she is extremely tired, which is not like her at all. Dr does not have any appts for several weeks. Pt hoping to see her asap.

## 2017-12-28 NOTE — Telephone Encounter (Signed)
Tried to contact patient by phone reached voicemail left message for patient to return call to office,

## 2018-01-02 NOTE — Telephone Encounter (Signed)
LMTCB. Please transfer pt to our office.  

## 2018-01-09 NOTE — Telephone Encounter (Signed)
LMTCB. Have attempted to call this pt several times and left message for pt to call back. Pt has not returned any calls.

## 2018-01-11 ENCOUNTER — Telehealth: Payer: Self-pay | Admitting: *Deleted

## 2018-01-11 DIAGNOSIS — E010 Iodine-deficiency related diffuse (endemic) goiter: Secondary | ICD-10-CM

## 2018-01-11 DIAGNOSIS — M79605 Pain in left leg: Secondary | ICD-10-CM

## 2018-01-11 DIAGNOSIS — M79604 Pain in right leg: Secondary | ICD-10-CM

## 2018-01-11 DIAGNOSIS — R5383 Other fatigue: Secondary | ICD-10-CM

## 2018-01-11 NOTE — Telephone Encounter (Signed)
Patient stated that the abdominal pain for last 6 weeks, and having flu like pain in joints for the last year. Patient stated you had worked her up for the joint pain a bout a year ago and the pain subsided for a while, now the joint pain is back along with abdominal pain that radiates over lower abdomen someday and then today is just right lower abdomen. Denies feer, chills , no nausea , no change in bowel habits or bladder,Patient rated pain at a 9 at times. Advised patient severe abdominal pain should be evaluated immediately and no appointment s today next available 01/24/18, patient staed she cannot go to ED for she has no insurance. Scheduled patient for 01/24/18 appointment.

## 2018-01-11 NOTE — Telephone Encounter (Signed)
Copied from CRM [id number]. Topic: Appointment Scheduling - Scheduling Inquiry for Clinic >> Dec 28, 2017  2:36 PM [name], [name] wrote: Reason for CRM:  Pt having on and off abdominal pain, severe pain all over, but more so in her legs.  Pt states the pain comes and goes, but is worse at times.  Pt doesn't know if it is stress, she has had stress in her life recently Pt states this has been going on 7 months or more.  Dr Darrick Huntsmanullo did labs last visit (8/18).  Pt states she is extremely tired, which is not like her at all. Dr does not have any appts for several weeks. Pt hoping to see her asap.  >> Jan 11, 2018 12:24 PM [name], [name], NT wrote: Patient called back unable to speak to someone at the office at this time. Patient is still having  symptoms of severe abdominal pain. Per office put in a message and they will call patient back. [phone]

## 2018-01-11 NOTE — Telephone Encounter (Signed)
Patient stated she would wait for appointment for lab work and would go to ED if pain came back.

## 2018-01-11 NOTE — Telephone Encounter (Signed)
Her workup last time included blood work last June  that was entirely normal. If she would like to  have the blood work done prior to the visit, I will  Order the labs , but I cannot see her asap unless I have a cancellation, as I have no appointments available .

## 2018-01-22 ENCOUNTER — Encounter: Payer: Self-pay | Admitting: Internal Medicine

## 2018-01-22 ENCOUNTER — Ambulatory Visit (INDEPENDENT_AMBULATORY_CARE_PROVIDER_SITE_OTHER): Payer: Self-pay | Admitting: Internal Medicine

## 2018-01-22 ENCOUNTER — Ambulatory Visit (INDEPENDENT_AMBULATORY_CARE_PROVIDER_SITE_OTHER): Payer: Self-pay

## 2018-01-22 ENCOUNTER — Other Ambulatory Visit: Payer: Self-pay

## 2018-01-22 VITALS — BP 132/72 | HR 86 | Temp 98.2°F | Wt 153.2 lb

## 2018-01-22 DIAGNOSIS — M79605 Pain in left leg: Secondary | ICD-10-CM

## 2018-01-22 DIAGNOSIS — M79604 Pain in right leg: Secondary | ICD-10-CM

## 2018-01-22 DIAGNOSIS — F411 Generalized anxiety disorder: Secondary | ICD-10-CM

## 2018-01-22 DIAGNOSIS — R1031 Right lower quadrant pain: Secondary | ICD-10-CM

## 2018-01-22 DIAGNOSIS — R5383 Other fatigue: Secondary | ICD-10-CM

## 2018-01-22 DIAGNOSIS — G8929 Other chronic pain: Secondary | ICD-10-CM

## 2018-01-22 DIAGNOSIS — M5442 Lumbago with sciatica, left side: Secondary | ICD-10-CM

## 2018-01-22 DIAGNOSIS — R1030 Lower abdominal pain, unspecified: Secondary | ICD-10-CM

## 2018-01-22 DIAGNOSIS — M5441 Lumbago with sciatica, right side: Secondary | ICD-10-CM

## 2018-01-22 LAB — POCT URINE PREGNANCY: Preg Test, Ur: NEGATIVE

## 2018-01-22 MED ORDER — DULOXETINE HCL 20 MG PO CPEP: 20.0000 mg | ORAL_CAPSULE | Freq: Every day | ORAL | 2 refills | Status: DC

## 2018-01-22 NOTE — Patient Instructions (Signed)
Labs and x ray today to evaluate your right lower quadrant pain (to rule out constipation and appendicitis)   Trial of cymbalta to address chronic pain, grief, and anxiety.  Start with 20 mg daily after dinner.  You may increase after 2 weeks to 40 mg
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
## 2018-01-23 DIAGNOSIS — M79605 Pain in left leg: Secondary | ICD-10-CM

## 2018-01-23 DIAGNOSIS — G8929 Other chronic pain: Secondary | ICD-10-CM | POA: Insufficient documentation

## 2018-01-23 DIAGNOSIS — R1030 Lower abdominal pain, unspecified: Secondary | ICD-10-CM | POA: Insufficient documentation

## 2018-01-23 DIAGNOSIS — M79604 Pain in right leg: Secondary | ICD-10-CM

## 2018-01-23 LAB — COMPREHENSIVE METABOLIC PANEL
ALT: 21 U/L (ref 0–35)
AST: 18 U/L (ref 0–37)
Albumin: 4.1 g/dL (ref 3.5–5.2)
Alkaline Phosphatase: 59 U/L (ref 39–117)
BUN: 12 mg/dL (ref 6–23)
CALCIUM: 8.9 mg/dL (ref 8.4–10.5)
CHLORIDE: 104 meq/L (ref 96–112)
CO2: 28 meq/L (ref 19–32)
Creatinine, Ser: 0.78 mg/dL (ref 0.40–1.20)
GFR: 85.45 mL/min (ref >60.00)
Glucose, Bld: 103 mg/dL — ABNORMAL HIGH (ref 70–99)
Potassium: 3.7 mEq/L (ref 3.5–5.1)
Sodium: 138 mEq/L (ref 135–145)
Total Bilirubin: 0.3 mg/dL (ref 0.2–1.2)
Total Protein: 6.7 g/dL (ref 6.0–8.3)

## 2018-01-23 LAB — URINALYSIS, ROUTINE W REFLEX MICROSCOPIC
Bilirubin Urine: NEGATIVE
KETONES UR: NEGATIVE
Leukocytes, UA: NEGATIVE
Nitrite: NEGATIVE
PH: 6 (ref 5.0–8.0)
Specific Gravity, Urine: 1.025 (ref 1.000–1.030)
Total Protein, Urine: NEGATIVE
UROBILINOGEN UA: 0.2 (ref 0.0–1.0)
Urine Glucose: NEGATIVE
WBC UA: NONE SEEN (ref 0–?)

## 2018-01-23 LAB — C-REACTIVE PROTEIN: CRP: 0.4 mg/dL — ABNORMAL LOW (ref 0.5–20.0)

## 2018-01-23 LAB — SEDIMENTATION RATE: SED RATE: 1 mm/h (ref 0–20)

## 2018-01-23 NOTE — Assessment & Plan Note (Signed)
Subacute,  With reports of new onset constipation.  Plain films today showed a normal bowel gas pattern. If trial of cymbalta does not help,  She will need pelvic  exam and ultrasound.

## 2018-01-23 NOTE — Assessment & Plan Note (Signed)
She has no signs of DDD on exam .  Her pain is diffuse and the etiology is unclear given her normal MSK and Neuro exam.  .  Trial of cymbalta for suspected fibromyalgia

## 2018-01-23 NOTE — Assessment & Plan Note (Signed)
This has been a  chronic complaint for her . Previous  Screening labs  In 2017 and 2018 were normal and will be repeated .  She has no history of snoring.  Recommended participating in regular exercise program with goal of achieving a minimum of 30 minutes of aerobic activity 5 days per week., once the source of her diffuse pain complaints is  identified  And treated.    Lab Results  Component Value Date   ESRSEDRATE 21 (H) 04/11/2017   Lab Results  Component Value Date   IRON 87 04/11/2017   TIBC 281 04/11/2017   FERRITIN 27.6 04/11/2017   Lab Results  Component Value Date   TSH 0.78 05/29/2017   Lab Results  Component Value Date   CREATININE 0.81 04/11/2017
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
## 2018-01-24 ENCOUNTER — Encounter: Payer: Self-pay | Admitting: Internal Medicine

## 2018-01-24 ENCOUNTER — Ambulatory Visit: Payer: Self-pay | Admitting: Internal Medicine

## 2018-01-24 DIAGNOSIS — K5900 Constipation, unspecified: Secondary | ICD-10-CM

## 2018-01-24 DIAGNOSIS — Z8041 Family history of malignant neoplasm of ovary: Secondary | ICD-10-CM

## 2018-01-24 DIAGNOSIS — R140 Abdominal distension (gaseous): Secondary | ICD-10-CM

## 2018-01-30 ENCOUNTER — Telehealth: Payer: Self-pay

## 2018-01-30 DIAGNOSIS — R140 Abdominal distension (gaseous): Secondary | ICD-10-CM

## 2018-01-30 DIAGNOSIS — Z1211 Encounter for screening for malignant neoplasm of colon: Secondary | ICD-10-CM

## 2018-01-30 DIAGNOSIS — K5900 Constipation, unspecified: Secondary | ICD-10-CM

## 2018-01-30 DIAGNOSIS — Z8041 Family history of malignant neoplasm of ovary: Secondary | ICD-10-CM

## 2018-01-30 NOTE — Telephone Encounter (Signed)
Reordered correct imaging

## 2018-02-02 ENCOUNTER — Ambulatory Visit
Admission: RE | Admit: 2018-02-02 | Discharge: 2018-02-02 | Disposition: A | Discharge status: Home / Self Care | Payer: Self-pay | Source: Ambulatory Visit | Attending: Internal Medicine | Admitting: Internal Medicine

## 2018-02-02 DIAGNOSIS — K5900 Constipation, unspecified: Secondary | ICD-10-CM | POA: Insufficient documentation

## 2018-02-02 DIAGNOSIS — Z8041 Family history of malignant neoplasm of ovary: Secondary | ICD-10-CM | POA: Insufficient documentation

## 2018-02-02 DIAGNOSIS — R140 Abdominal distension (gaseous): Secondary | ICD-10-CM | POA: Insufficient documentation

## 2018-02-04 ENCOUNTER — Encounter: Payer: Self-pay | Admitting: Internal Medicine

## 2018-02-05 ENCOUNTER — Other Ambulatory Visit: Payer: Self-pay | Admitting: Internal Medicine

## 2018-02-05 DIAGNOSIS — G8929 Other chronic pain: Secondary | ICD-10-CM

## 2018-02-05 DIAGNOSIS — M545 Low back pain, unspecified: Secondary | ICD-10-CM

## 2018-02-05 NOTE — Progress Notes (Signed)
MyChart message sent RE ULTRASOUND.  PATIENT NOW REQUEST X RAY SO FOWER BACK DUE TO PERSISTENT SEVERE PAIN

## 2018-02-21 ENCOUNTER — Ambulatory Visit: Payer: Self-pay | Admitting: Internal Medicine

## 2018-02-21 DIAGNOSIS — Z0289 Encounter for other administrative examinations: Secondary | ICD-10-CM

## 2018-03-11 ENCOUNTER — Other Ambulatory Visit: Payer: Self-pay | Admitting: Internal Medicine

## 2018-03-12 NOTE — Telephone Encounter (Signed)
Filled: 01/22/2018 Was supposed to follow up in one month. No showed for appt on 02/21/2018. Next OV: not scheduled

## 2018-04-16 ENCOUNTER — Encounter: Payer: Self-pay | Admitting: Internal Medicine

## 2018-04-17 ENCOUNTER — Other Ambulatory Visit: Payer: Self-pay | Admitting: Internal Medicine

## 2018-05-03 ENCOUNTER — Encounter: Payer: Self-pay | Admitting: Internal Medicine

## 2018-05-03 ENCOUNTER — Ambulatory Visit (INDEPENDENT_AMBULATORY_CARE_PROVIDER_SITE_OTHER): Payer: Self-pay

## 2018-05-03 DIAGNOSIS — G8929 Other chronic pain: Secondary | ICD-10-CM

## 2018-05-03 DIAGNOSIS — M545 Low back pain, unspecified: Secondary | ICD-10-CM

## 2018-05-04 ENCOUNTER — Encounter (INDEPENDENT_AMBULATORY_CARE_PROVIDER_SITE_OTHER): Payer: Self-pay

## 2018-06-11 ENCOUNTER — Telehealth: Payer: Self-pay | Admitting: Internal Medicine

## 2018-06-11 ENCOUNTER — Other Ambulatory Visit: Payer: Self-pay

## 2018-06-11 MED ORDER — DULOXETINE HCL 20 MG PO CPEP: ORAL_CAPSULE | ORAL | 0 refills | Status: DC

## 2018-06-11 NOTE — Telephone Encounter (Signed)
Ok to refill? The medication has a note in chart stating she needs an appointment prior to giving refill. Please advise.

## 2018-06-11 NOTE — Telephone Encounter (Signed)
LOV 01/22/18  Dr. Darrick Huntsmanullo Last refill 03/12/18

## 2018-06-11 NOTE — Telephone Encounter (Signed)
rx request Dr. Darrick Huntsmanullo patient

## 2018-06-11 NOTE — Telephone Encounter (Signed)
Not correct.   Last appt April  So ok to fill. 30 days [facility]. Patient notified via mychart

## 2018-06-11 NOTE — Telephone Encounter (Signed)
Copied from CRM [id number]. Topic: Quick Communication - See Telephone Encounter >> Jun 11, 2018  9:04 AM Jens SomMedley, Jennifer A wrote: CRM for notification. See Telephone encounter for: 06/11/18.[place] she left her DULoxetine (CYMBALTA) 20 MG capsule [[id number]][[id number]]  at home. The last time that she took the medidcation is 06/09/18.  She missed taking on 06/10/18.  At this time she is [facility] [address] [place] [phone].  She will be returning on Thursday 06/14/18.  Patient asked if a message can be sent via MyChart.

## 2018-09-26 ENCOUNTER — Ambulatory Visit: Payer: Self-pay

## 2018-10-01 ENCOUNTER — Telehealth: Payer: Self-pay

## 2018-10-01 DIAGNOSIS — R5383 Other fatigue: Secondary | ICD-10-CM

## 2018-10-01 NOTE — Telephone Encounter (Signed)
Copied from CRM [id number]. Topic: Appointment Scheduling - Scheduling Inquiry for Clinic >> Oct 01, 2018  4:09 PM Arlyss Gandyichardson, Taren N, NT wrote: Reason for CRM: Pt is scheduled for 11/05/18 at 4pm with Dr. Darrick Huntsmanullo to discuss some issues she has been having. Pt would like to see if she can be worked in earlier. Please advise.

## 2018-10-02 NOTE — Telephone Encounter (Signed)
Yes if there are other more urgent issues  Like depression/anxiety,   but I will not use an urgent/acute slot  for weight loss management,  bc that's not an urgent issue.

## 2018-10-02 NOTE — Telephone Encounter (Signed)
Would it be okay to schedule pt sooner than 11/05/2017 to discuss weight loss management.

## 2018-10-03 ENCOUNTER — Other Ambulatory Visit: Payer: Self-pay | Admitting: *Deleted

## 2018-10-03 ENCOUNTER — Ambulatory Visit: Payer: Self-pay | Attending: Oncology | Admitting: *Deleted

## 2018-10-03 ENCOUNTER — Encounter: Payer: Self-pay | Admitting: *Deleted

## 2018-10-03 ENCOUNTER — Ambulatory Visit
Admission: RE | Admit: 2018-10-03 | Discharge: 2018-10-03 | Disposition: A | Discharge status: Home / Self Care | Payer: Self-pay | Source: Ambulatory Visit | Attending: Oncology | Admitting: Oncology

## 2018-10-03 VITALS — BP 110/68 | HR 82 | Temp 95.8°F | Resp 18 | Ht 67.32 in | Wt 158.5 lb

## 2018-10-03 DIAGNOSIS — Z0000 Encounter for general adult medical examination without abnormal findings: Secondary | ICD-10-CM

## 2018-10-03 DIAGNOSIS — N63 Unspecified lump in unspecified breast: Secondary | ICD-10-CM | POA: Insufficient documentation

## 2018-10-03 NOTE — Addendum Note (Signed)
Addended by: Jim LikeLAMBERT, Talib Headley M on: 10/03/2018 10:59 AM   Modules accepted: Orders

## 2018-10-03 NOTE — Progress Notes (Signed)
  Subjective:     Patient ID: Paige [name], female   DOB: May 07, 1976, 42 y.o.   MRN: [id number]  HPI   Review of Systems     Objective:   Physical Exam  Pulmonary/Chest: Right breast exhibits mass. Right breast exhibits no inverted nipple, no nipple discharge, no skin change and no tenderness. Left breast exhibits mass. Left breast exhibits no inverted nipple, no nipple discharge, no skin change and no tenderness. No breast swelling, tenderness, discharge or bleeding. Breasts are symmetrical.    Abdominal: There is no splenomegaly or hepatomegaly.  Genitourinary: No labial fusion. There is no rash, tenderness, lesion or injury on the right labia. There is no rash, tenderness, lesion or injury on the left labia. Uterus is not deviated, not enlarged, not fixed and not tender. Cervix exhibits no motion tenderness, no discharge and no friability. Right adnexum displays no mass, no tenderness and no fullness. Left adnexum displays no mass, no tenderness and no fullness. No erythema, tenderness or bleeding in the vagina. No foreign body in the vagina. No signs of injury around the vagina. No vaginal discharge found.         Assessment:     42 year old White female returns to [facility] for annual screening.  Last mammogram was a birads 2 showing right breast cyst.  On clinical breast exam today I can palpate an approximate 1 cm mass at 4:00 2 cm from the nipple that is tender to palpation.  There is also a left breast nodule at 6:00 left breast that is not tender to palpation.  Patient with significant family history of cancer.  Maternal grandmother with breast cancer at 52, and a maternal aunt with ovarian cancer diagnosed in her 89's. Patient states she has had genetic testing, and was BRCA negative.  States her mom is BRCA positive.  Informed patient she is still considered at increased risk for breast and ovarian cancer based on family history, and should be diligent with her screening.  Taught  self breast exam.  Specimen collected for pap smear without difficulty.  Thre is no mass or lesion noted on exam. Patient does complain of left lower quadrant pain occasionally.  Encouraged her to follow up in regards to the pain with a gyn where she had her genetic testing.  Patient has been screened for eligibility.  She does not have any insurance, Medicare or Medicaid.  She also meets financial eligibility.  Hand-out given on the Affordable Care Act. Risk Assessment    Risk Scores      10/03/2018   Last edited by: Danie Binder, RN   5-year risk: 1.3 %   Lifetime risk: 15.9 %            Plan:     Bilateral diagnostic mammogram and ultrasound ordered.  Specimen for pap sent to the lab. Will follow up per [facility] protocol.

## 2018-10-03 NOTE — Patient Instructions (Signed)
HPV Test The human papillomavirus (HPV) test is used to look for high-risk types of HPV infection. HPV is a group of about 100 viruses. Many of these viruses cause growths on, in, or around the genitals. Most HPV viruses cause infections that usually go away without treatment. However, HPV types 6, 11, 16, and 18 are considered high-risk types of HPV that can increase your risk of cancer of the cervix or anus if the infection is left untreated. An HPV test identifies the DNA (genetic) strands of the HPV infection, so it is also referred to as the HPV DNA test. Although HPV is found in both males and females, the HPV test is only used to screen for increased cancer risk in females:  With an abnormal Pap test.  After treatment of an abnormal Pap test.  Between the ages of 1330 and 7065.  After treatment of a high-risk HPV infection.  The HPV test may be done at the same time as a pelvic exam and Pap test in females over the age of 44. Both the HPV test and Pap test require a sample of cells from the cervix. How do I prepare for this test?  Do not douche or take a bath for 24-48 hours before the test or as directed by your health care provider.  Do not have sex for 24-48 hours before the test or as directed by your health care provider.  You may be asked to reschedule the test if you are menstruating.  You will be asked to urinate before the test. What do the results mean? It is your responsibility to obtain your test results. Ask the lab or department performing the test when and how you will get your results. Talk with your health care provider if you have any questions about your results. Your result will be negative or positive. Meaning of Negative Test Results A negative HPV test result means that no HPV was found, and it is very likely that you do not have HPV. Meaning of Positive Test Results A positive HPV test result indicates that you have HPV.  If your test result shows the presence  of any high-risk HPV strains, you may have an increased risk of developing cancer of the cervix or anus if the infection is left untreated.  If any low-risk HPV strains are found, you are not likely to have an increased risk of cancer.  Discuss your test results with your health care provider. He or she will use the results to make a diagnosis and determine a treatment plan that is right for you. Talk with your health care provider to discuss your results, treatment options, and if necessary, the need for more tests. Talk with your health care provider if you have any questions about your results. This information is not intended to replace advice given to you by your health care provider. Make sure you discuss any questions you have with your health care provider. Document Released: 11/04/2004 Document Revised: 06/15/2016 Document Reviewed: 02/25/2014 Elsevier Interactive Patient Education  2018 [employer] Gave patient hand-out, Women Staying Healthy, Active and Well from [facility], with education on breast health, pap smears, heart and colon health.  Marland [email or web address]

## 2018-10-04 NOTE — Telephone Encounter (Signed)
I have ordered labs for investigation of fatiguen  and weight gain,  Keep the jan 13 appointment ,

## 2018-10-04 NOTE — Telephone Encounter (Signed)
Spoke with pt and she stated that she is having issues with her energy level, feeling fatigued all the time and weight gain. The pt stated that she has had a previous thyroid nodule and was told that it was too small to do anything with so she is wondering if something is going on with her thyroid. Pt is scheduled for 11/05/2018 and was wanting to be sooner but is willing to keep her appt scheduled in January if we can do some lab work to check her thyroid, b12 level and whatever else you think needs to be checked before her appt on 11/05/2018. Pt also stated that she has had a lot of traumatic events in last year, she stated that her brother commited suicide last year, her dad got killed, and her daughter was in a head on collision with a drunk driver and the drunk driver got died. Asked the pt if she was having any problems with depression or anxiety and she stated that she doesn't like to take medication if she can help it so she is trying to handle it with essential oils and natural products. Is it okay to keep the pt scheduled for 11/05/2018 with a prior lab appt or do you want me to schedule her sooner?

## 2018-10-04 NOTE — Addendum Note (Signed)
Addended by: Sherlene ShamsULLO, Ayomide Purdy L on: 10/04/2018 10:08 PM   Modules accepted: Orders

## 2018-10-05 NOTE — Telephone Encounter (Signed)
Spoke with pt and she has been scheduled for a non fasting lab appt. Pt is aware of appt date and time.

## 2018-10-06 LAB — PAP LB AND HPV HIGH-RISK: HPV, high-risk: NEGATIVE

## 2018-10-08 ENCOUNTER — Other Ambulatory Visit: Payer: Self-pay

## 2018-10-08 ENCOUNTER — Encounter: Payer: Self-pay | Admitting: *Deleted

## 2018-10-08 NOTE — Progress Notes (Unsigned)
Tried to call patient to discuss her pap and mammogram results.  Both are normal.  I would like to offer a surgical consult for the palpable nodules on clinical breast exam.  Will try again later.

## 2018-10-09 ENCOUNTER — Other Ambulatory Visit: Payer: Self-pay

## 2018-10-09 ENCOUNTER — Other Ambulatory Visit (INDEPENDENT_AMBULATORY_CARE_PROVIDER_SITE_OTHER): Payer: Self-pay

## 2018-10-09 DIAGNOSIS — R5383 Other fatigue: Secondary | ICD-10-CM

## 2018-10-10 LAB — CBC WITH DIFFERENTIAL/PLATELET
Basophils Absolute: 0.1 10*3/uL (ref 0.0–0.1)
Basophils Relative: 1.2 % (ref 0.0–3.0)
EOS ABS: 1 10*3/uL — AB (ref 0.0–0.7)
EOS PCT: 10.8 % — AB (ref 0.0–5.0)
HCT: 38.7 % (ref 36.0–46.0)
HEMOGLOBIN: 12.7 g/dL (ref 12.0–15.0)
Lymphocytes Relative: 33.2 % (ref 12.0–46.0)
Lymphs Abs: 3.2 10*3/uL (ref 0.7–4.0)
MCHC: 32.8 g/dL (ref 30.0–36.0)
MCV: 85.6 fl (ref 78.0–100.0)
MONO ABS: 0.6 10*3/uL (ref 0.1–1.0)
Monocytes Relative: 6 % (ref 3.0–12.0)
Neutro Abs: 4.7 10*3/uL (ref 1.4–7.7)
Neutrophils Relative %: 48.8 % (ref 43.0–77.0)
Platelets: 335 10*3/uL (ref 150.0–400.0)
RBC: 4.52 Mil/uL (ref 3.87–5.11)
RDW: 13.6 % (ref 11.5–15.5)
WBC: 9.6 10*3/uL (ref 4.0–10.5)

## 2018-10-10 LAB — FOLATE RBC: RBC FOLATE: 780 ng/mL (ref >280)

## 2018-10-10 LAB — COMPREHENSIVE METABOLIC PANEL
ALBUMIN: 4.2 g/dL (ref 3.5–5.2)
ALT: 18 U/L (ref 0–35)
AST: 18 U/L (ref 0–37)
Alkaline Phosphatase: 50 U/L (ref 39–117)
BUN: 11 mg/dL (ref 6–23)
CALCIUM: 9.1 mg/dL (ref 8.4–10.5)
CHLORIDE: 107 meq/L (ref 96–112)
CO2: 27 mEq/L (ref 19–32)
CREATININE: 0.89 mg/dL (ref 0.40–1.20)
GFR: 73.14 mL/min (ref >60.00)
Glucose, Bld: 80 mg/dL (ref 70–99)
POTASSIUM: 3.8 meq/L (ref 3.5–5.1)
Sodium: 140 mEq/L (ref 135–145)
Total Bilirubin: 0.2 mg/dL (ref 0.2–1.2)
Total Protein: 6.6 g/dL (ref 6.0–8.3)

## 2018-10-10 LAB — VITAMIN B12: VITAMIN B 12: 447 pg/mL (ref 211–911)

## 2018-10-10 LAB — TSH: TSH: 1.57 u[IU]/mL (ref 0.35–4.50)

## 2018-10-25 ENCOUNTER — Telehealth: Payer: Self-pay

## 2018-10-25 ENCOUNTER — Ambulatory Visit: Payer: Self-pay

## 2018-10-25 DIAGNOSIS — M5432 Sciatica, left side: Secondary | ICD-10-CM

## 2018-10-25 NOTE — Addendum Note (Signed)
Addended by: Sherlene ShamsULLO, Shadiamond Koska L on: 10/25/2018 12:38 PM   Modules accepted: Orders

## 2018-10-25 NOTE — Telephone Encounter (Signed)
Phone call to pt.  Stated she is in "excruciating pain left leg".  Reported she has had the pain low back radiating down left leg for about 10 mos., but the severity and constancy has been present over past week.  Has taken Ibuprofen, but denied any relief.  C/o increased pressure/ freq. of urination last night; reported went to BR x 3 in short period of time.  Denied burning with urination.  C/o numbness/ tingling of left leg that has been present.  C/o being very tired.  Reported with hx of Fibromyalgia, she took Cymbalta for the pain for a couple mos., but did not like how she felt on it, and eventually stopped taking this.  Advised with severity of pain, she will need to be evaluated in the ER.  Pt. Verb. Understanding.  Reported her husband will take her to the ER.       Reason for Disposition . [1] SEVERE pain (e.g., excruciating, unable to do any normal activities) AND [2] not improved after 2 hours of pain medicine  Answer Assessment - Initial Assessment Questions 1. ONSET: "When did the pain start?"      About 10 mos. Ago  2. LOCATION: "Where is the pain located?"     It shifts; Lower back /buttock and down into the left leg 3. PAIN: "How bad is the pain?"    (Scale 1-10; or mild, moderate, severe)   -  MILD (1-3): doesn't interfere with normal activities    -  MODERATE (4-7): interferes with normal activities (e.g., work or school) or awakens from sleep, limping    -  SEVERE (8-10): excruciating pain, unable to do any normal activities, unable to walk    No relief of pain ; 10/10; cannot sit or lay; nothing relieves it  4. WORK OR EXERCISE: "Has there been any recent work or exercise that involved this part of the body?"      none 5. CAUSE: "What do you think is causing the leg pain?"     unknown 6. OTHER SYMPTOMS: "Do you have any other symptoms?" (e.g., chest pain, back pain, breathing difficulty, swelling, rash, fever, numbness, weakness)     Pressure and frequency of urination  since last night but denied burning; numbness /tingling all the way down the leg 7. PREGNANCY: "Is there any chance you are pregnant?" "When was your last menstrual period?"     LMP; 12/19  Protocols used: LEG PAIN-A-AH  Message from Crist Infante sent at 10/25/2018 8:33 AM EST   Summary: advice   Pt states she is having severe leg pain, and has sent a message to Dr Darrick Huntsman about getting a MRI for this pain. Pt states this pain is severe she cannot walk. Pt does not have insurance and does not know what to do. Pt does not want to go to the ED if she can get the MRI. Pt states it gets worse by the day. It has been going on 10 months, but has been this bad since Christmas. Pt asked if she does go to the ED, will they do an MRI, or do they only CT?

## 2018-10-25 NOTE — Telephone Encounter (Signed)
I HAVE ORDERED THE MRI OF LUMBAR SPINE.  IN ORDER TO GET IT DONE QUICKLY, SHE MAY HAVE TO GO TO  [facility] .  THE OFFICE WILL CALL HER WITH THE APPOINTMENT

## 2018-10-25 NOTE — Telephone Encounter (Signed)
Telephone call from Patient. Related message from Dr.  Darrick Huntsmanullo. Patient voiced understanding and awaits call from office relating information about the appointment.   This encounter was created in error - please disregard.

## 2018-10-25 NOTE — Telephone Encounter (Signed)
Left message to return call to office. [facility] nurse may advise.

## 2018-10-25 NOTE — Telephone Encounter (Signed)
FYI patient husband is taking her to ED for severe pain in left leg.

## 2018-10-25 NOTE — Telephone Encounter (Signed)
Pt. Called back and states she will not go to ED for evaluation. States "I have a nurse friend who says that is ridiculous. Dr. Darrick Huntsman should just order some imaging for me without me having to got to the ED."  States Dr. Darrick Huntsman knows that she has been having this pain. Please advise pt.

## 2018-10-25 NOTE — Telephone Encounter (Signed)
Telephone call from Patient. Related message from Dr.  Darrick Huntsmanullo.From 10/25/18.  Patient voiced understanding and awaits call from office relating information about the appointment.

## 2018-10-26 ENCOUNTER — Encounter: Payer: Self-pay | Admitting: *Deleted

## 2018-10-26 NOTE — Progress Notes (Signed)
Called and spoke to patient today regarding her normal mammogram and pap smear results.  Since I could palpate bilateral breast masses on clinical breast exam, I offered patient an opportunity for surgical consult or to return to see me for repeat breast exam in a couple of Months.  Patient prefers to return to [facility] clinic on 01/02/19 @ 9:00 for repeat clinical breast exam.  HSIS to [place].

## 2018-10-26 NOTE — Telephone Encounter (Signed)
See my chart message

## 2018-11-02 ENCOUNTER — Telehealth: Payer: Self-pay | Admitting: Internal Medicine

## 2018-11-02 NOTE — Telephone Encounter (Signed)
Copied from CRM [id number]. Topic: Referral - Request for Referral >> Nov 02, 2018 10:30 AM Marylen Ponto wrote: Has patient seen PCP for this complaint? yes  *If NO, is insurance requiring patient see PCP for this issue before PCP can refer them? Referral for which specialty: Spine Center or Neurosurgeon  Preferred provider/office: No specific provider / no specific location Reason for referral: Pt stated she had a MRI done in [facility] and she was advised that she has a bulging disc. Pt stated she received a referral from the provider in [facility] however the Spine Center that she was referred to can not see her until March and she was told that she should not wait that long. Pt stated she bring in the MRI if needed.

## 2018-11-05 ENCOUNTER — Encounter

## 2018-11-05 ENCOUNTER — Encounter: Payer: Self-pay | Admitting: Internal Medicine

## 2018-11-05 ENCOUNTER — Ambulatory Visit (INDEPENDENT_AMBULATORY_CARE_PROVIDER_SITE_OTHER): Payer: Self-pay | Admitting: Internal Medicine

## 2018-11-05 ENCOUNTER — Other Ambulatory Visit: Payer: Self-pay | Admitting: Internal Medicine

## 2018-11-05 DIAGNOSIS — M5387 Other specified dorsopathies, lumbosacral region: Secondary | ICD-10-CM

## 2018-11-05 MED ORDER — PREDNISONE 10 MG PO TABS: ORAL_TABLET | ORAL | 0 refills | Status: DC

## 2018-11-05 MED ORDER — HYDROCODONE-ACETAMINOPHEN 10-325 MG PO TABS: 1.0000 | ORAL_TABLET | Freq: Four times a day (QID) | ORAL | 0 refills | Status: DC | PRN

## 2018-11-05 NOTE — Telephone Encounter (Signed)
Pt is in the office today and the referral for neurosurgeon was discussed.

## 2018-11-05 NOTE — Progress Notes (Signed)
Subjective:  Patient ID: Paige [name], female    DOB: Jan 01, 1976  Age: 43 y.o. MRN: [id number]  CC: The encounter diagnosis was Sciatica of left side associated with disorder of lumbosacral spine.  HPI Paige [name] presents for follow up on MRI of lumbar spine that was done at [facility] On Jan 10th. She was treated urgently for  persistent moderate to severe low back pain with left sided sciatica that started the week prior to Christmas without an inciting event or fall.  Her MRI confirmed that she has a posteriorly bulging disk at L5 which resulting  Mild spinal stenosis and moderate foraminal stenosis  She is in severe pain at time of visit.  She reports that the provider at urgent care treated her with a Medrol dose pack and flexeril  which helped only transiently .  She  Was provided with meloxicam at one week follow up and reports that the pain has been unrelenting and severe.  .  The pain  radiates to top of foot and hte foot feels tingly and numb .  She cannot find a comfortable position. And can't roll over in bed because  any manipulation of her hips causes exquisite pain .  She has been given a neurosurgery appt that is scheduled  for mid march,  But is requesting an earlier appointment and is ready to consider surgery if appropriate.    Outpatient Medications Prior to Visit  Medication Sig Dispense Refill  . meloxicam (MOBIC) 7.5 MG tablet Take by mouth.    . DULoxetine (CYMBALTA) 20 MG capsule TAKE 1 CAPSULE BY MOUTH EVERY DAY (Patient not taking: Reported on 11/05/2018) 30 capsule 0  . omeprazole (PRILOSEC) 40 MG capsule Take 1 capsule (40 mg total) by mouth daily. (Patient not taking: Reported on 01/22/2018) 90 capsule 2   No facility-administered medications prior to visit.     Review of Systems;  Patient denies headache, fevers, malaise, unintentional weight loss, skin rash, eye pain, sinus congestion and sinus pain, sore throat, dysphagia,  hemoptysis , cough, dyspnea, wheezing,  chest pain, palpitations, orthopnea, edema, abdominal pain, nausea, melena, diarrhea, constipation, flank pain, dysuria, hematuria, urinary  Frequency, nocturia, , seizures,  Focal weakness, Loss of consciousness,  Tremor, insomnia, depression, anxiety, and suicidal ideation.      Objective:  BP 104/76 (BP Location: Left Arm, Patient Position: Sitting, Cuff Size: Normal)   Pulse 93   Temp 98 F (36.7 C) (Oral)   Resp 15   Ht 5\' 7"  (1.702 m)   Wt 158 lb 12.8 oz (72 kg)   SpO2 96%   BMI 24.87 kg/m   BP Readings from Last 3 Encounters:  11/05/18 104/76  10/03/18 110/68  01/22/18 132/72    Wt Readings from Last 3 Encounters:  11/05/18 158 lb 12.8 oz (72 kg)  10/03/18 158 lb 8 oz (71.9 kg)  01/22/18 153 lb 3.2 oz (69.5 kg)    General appearance: alert, cooperative and appears stated age Ears: normal TM's and external ear canals both ears Throat: lips, mucosa, and tongue normal; teeth and gums normal Neck: no adenopathy, no carotid bruit, supple, symmetrical, trachea midline and thyroid not enlarged, symmetric, no tenderness/mass/nodules Back: symmetric, no curvature. ROM restricted to upright due to severe pain with flexion.  No significant paraspinus muscle spasm.   No CVA tenderness. POSITIVE STRAIGHT LEG LIFT ON LEFT. DTRS hyperreflexive .  Lungs: clear to auscultation bilaterally Heart: regular rate and rhythm, S1, S2 normal, no murmur, click, rub  or gallop Abdomen: soft, non-tender; bowel sounds normal; no masses,  no organomegaly Pulses: 2+ and symmetric Skin: Skin color, texture, turgor normal. No rashes or lesions Lymph nodes: Cervical, supraclavicular, and axillary nodes normal.  No results found for: HGBA1C  Lab Results  Component Value Date   CREATININE 0.89 10/09/2018   CREATININE 0.78 01/22/2018   CREATININE 0.81 04/11/2017    Lab Results  Component Value Date   WBC 9.6 10/09/2018   HGB 12.7 10/09/2018   HCT 38.7 10/09/2018   PLT 335.0 10/09/2018    GLUCOSE 80 10/09/2018   ALT 18 10/09/2018   AST 18 10/09/2018   NA 140 10/09/2018   K 3.8 10/09/2018   CL 107 10/09/2018   CREATININE 0.89 10/09/2018   BUN 11 10/09/2018   CO2 27 10/09/2018   TSH 1.57 10/09/2018    Assessment & Plan:   Problem List Items Addressed This Visit    Sciatica of left side associated with disorder of lumbosacral spine    Secondary to bulging disk at L5 level with 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  (MRI done at [facility]) .  She has had intermittent non radiating back pain for at least a year,  But her symptoms have been severe and radiating  for 3 weeks.  Will repeat a longer steroid taper,  Followed by daily meloxicam and add Vicodin for pain control.  Urgent Neurosurgical referral made.             I have discontinued Amzie L. Raudenbush's omeprazole and DULoxetine. I am also having her start on predniSONE. Additionally, I am having her maintain her meloxicam and HYDROcodone-acetaminophen.  Meds ordered this encounter  Medications  . DISCONTD: HYDROcodone-acetaminophen (NORCO) 10-325 MG tablet    Sig: Take 1 tablet by mouth every 6 (six) hours as needed.    Dispense:  30 tablet    Refill:  0  . predniSONE (DELTASONE) 10 MG tablet    Sig: 6 tablets daily for 3 days, then reduce by 1 tablet daily until gone    Dispense:  33 tablet    Refill:  0  . HYDROcodone-acetaminophen (NORCO) 10-325 MG tablet    Sig: Take 1 tablet by mouth every 6 (six) hours as needed.    Dispense:  30 tablet    Refill:  0    Medications Discontinued During This Encounter  Medication Reason  . DULoxetine (CYMBALTA) 20 MG capsule Patient has not taken in last 30 days  . omeprazole (PRILOSEC) 40 MG capsule Patient has not taken in last 30 days  . HYDROcodone-acetaminophen (NORCO) 10-325 MG tablet Reorder    Follow-up: No follow-ups on file.   Sherlene Shams, MD

## 2018-11-05 NOTE — Patient Instructions (Signed)
You have a bulging disk that is pushing on the spinal cord and the nerve root.  I have made an urgent Neurosurgical referral   I am putting you on prednisone for 8 days.  Suspend the meloxicam during this period  I am prescribing Vicodin to take 1/2 to 1 tablet every 6 hours .  THIS IS A NARCOTIC. DO NOT SHARE WITH OTHER SND DO NOT MIX WITH ALCOHOL.  I can refill it in one week for a longer period of time if needed   IT WILL CONSTIPATE YOU!  TAKE A LAXATIVE NIGHTLY

## 2018-11-06 DIAGNOSIS — M5387 Other specified dorsopathies, lumbosacral region: Secondary | ICD-10-CM | POA: Insufficient documentation

## 2018-11-06 NOTE — Assessment & Plan Note (Signed)
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  (MRI done at [facility]) .  She has had intermittent non radiating back pain for at least a year,  But her symptoms have been severe and radiating  for 3 weeks.  Will repeat a longer steroid taper,  Followed by daily meloxicam and add Vicodin for pain control.  Urgent Neurosurgical referral made.

## 2018-11-08 ENCOUNTER — Telehealth: Payer: Self-pay | Admitting: *Deleted

## 2018-11-08 NOTE — Telephone Encounter (Signed)
I cannot say for sure if the headache is related to her lumbar spinal stenosis

## 2018-11-08 NOTE — Telephone Encounter (Signed)
Copied from CRM [id number]. Topic: Referral - Request for Referral >> Nov 02, 2018 10:30 AM Marylen Ponto wrote: Has patient seen PCP for this complaint? yes  *If NO, is insurance requiring patient see PCP for this issue before PCP can refer them? Referral for which specialty: Spine Center or Neurosurgeon  Preferred provider/office: No specific provider / no specific location Reason for referral: Pt stated she had a MRI done in [facility] and she was advised that she has a bulging disc. Pt stated she received a referral from the provider in [facility] however the Spine Center that she was referred to can not see her until March and she was told that she should not wait that long. Pt stated she bring in the MRI if needed. >> Nov 07, 2018  5:05 PM Wyonia Hough E wrote: Pt called inquiring about her stat referral for neurosurgery. Pt states her leg is numb and she can feel it getting worse/ please call Pt asap in the morning to advise of the referral

## 2018-11-08 NOTE — Telephone Encounter (Signed)
Patient say she is having numbness in both legs feels like when blood circulation is cut off like they are going to sleep.Denies color changes , normal temperature in legs. Has had a headache for four days took VIcodin for last night for back , but this did not help her headache either patient wondering if headache is related to what she is experiencing? Requesting status of referral , ( notified Ronnetta she will update on status of referral).

## 2018-11-12 NOTE — Telephone Encounter (Signed)
She is scheduled on 1/21 with Dr. Lucy Chris at [facility] Neurosurgery

## 2018-11-14 MED ORDER — ONDANSETRON 4 MG PO TBDP
4.0000 mg | ORAL_TABLET | Freq: Three times a day (TID) | ORAL | 0 refills | Status: DC | PRN
Start: 2018-11-14 — End: 2019-08-08

## 2018-11-14 MED ORDER — TRAMADOL HCL 50 MG PO TABS: 100.0000 mg | ORAL_TABLET | Freq: Four times a day (QID) | ORAL | 0 refills | Status: DC | PRN

## 2018-11-23 ENCOUNTER — Encounter
Admission: RE | Admit: 2018-11-23 | Discharge: 2018-11-23 | Disposition: A | Discharge status: Home / Self Care | Payer: Self-pay | Source: Ambulatory Visit | Attending: Neurosurgery | Admitting: Neurosurgery

## 2018-11-23 ENCOUNTER — Other Ambulatory Visit: Payer: Self-pay

## 2018-11-23 ENCOUNTER — Ambulatory Visit
Admission: RE | Admit: 2018-11-23 | Discharge: 2018-11-23 | Disposition: A | Discharge status: Home / Self Care | Payer: Self-pay | Source: Ambulatory Visit | Attending: Neurosurgery | Admitting: Neurosurgery

## 2018-11-23 DIAGNOSIS — Z419 Encounter for procedure for purposes other than remedying health state, unspecified: Secondary | ICD-10-CM

## 2018-11-23 HISTORY — DX: Personal history of urinary calculi: Z87.442

## 2018-11-23 LAB — URINALYSIS, ROUTINE W REFLEX MICROSCOPIC
BILIRUBIN URINE: NEGATIVE
Glucose, UA: NEGATIVE mg/dL
Hgb urine dipstick: NEGATIVE
Ketones, ur: NEGATIVE mg/dL
Leukocytes, UA: NEGATIVE
NITRITE: NEGATIVE
Protein, ur: NEGATIVE mg/dL
Specific Gravity, Urine: 1.019 (ref 1.005–1.030)
pH: 6 (ref 5.0–8.0)

## 2018-11-23 LAB — PROTIME-INR
INR: 0.91
PROTHROMBIN TIME: 12.2 s (ref 11.4–15.2)

## 2018-11-23 LAB — TYPE AND SCREEN
ABO/RH(D): O POS
ANTIBODY SCREEN: NEGATIVE

## 2018-11-23 LAB — SURGICAL PCR SCREEN
MRSA, PCR: NEGATIVE
STAPHYLOCOCCUS AUREUS: NEGATIVE

## 2018-11-23 LAB — APTT: aPTT: 26 seconds (ref 24–36)

## 2018-11-23 NOTE — Patient Instructions (Signed)
Your procedure is scheduled on:Mon. 2/10 Report to Day Surgery. To find out your arrival time please call [phone] between 1PM - 3PM on Friday 11/30/18.  Remember: Instructions that are not followed completely may result in serious medical risk,  up to and including death, or upon the discretion of your surgeon and anesthesiologist your  surgery may need to be rescheduled.     _X__ 1. Do not eat food after midnight the night before your procedure.                 No gum chewing or hard candies. You may drink clear liquids up to 2 hours                 before you are scheduled to arrive for your surgery- DO not drink clear                 liquids within 2 hours of the start of your surgery.                 Clear Liquids include:  water, apple juice without pulp, clear carbohydrate                 drink such as Clearfast of Gatorade, Black Coffee or Tea (Do not add                 anything to coffee or tea).  __X__2.  On the morning of surgery brush your teeth with toothpaste and water, you                may rinse your mouth with mouthwash if you wish.  Do not swallow any toothpaste of mouthwash.     _X__ 3.  No Alcohol for 24 hours before or after surgery.   ___ 4.  Do Not Smoke or use e-cigarettes For 24 Hours Prior to Your Surgery.                 Do not use any chewable tobacco products for at least 6 hours prior to                 surgery.  ____  5.  Bring all medications with you on the day of surgery if instructed.   __x__  6.  Notify your doctor if there is any change in your medical condition      (cold, fever, infections).     Do not wear jewelry, make-up, hairpins, clips or nail polish. Do not wear lotions, powders, or perfumes. You may wear deodorant. Do not shave 48 hours prior to surgery. Men may shave face and neck. Do not bring valuables to the hospital.    [facility] is not responsible for any belongings or valuables.  Contacts,  dentures or bridgework may not be worn into surgery. Leave your suitcase in the car. After surgery it may be brought to your room. For patients admitted to the hospital, discharge time is determined by your treatment team.   Patients discharged the day of surgery will not be allowed to drive home.   Please read over the following fact sheets that you were given:    x____ Take these medicines the morning of surgery with A SIP OF WATER:    1. HYDROcodone-acetaminophen (NORCO) 10-325 MG tablet if needed  2.   3.   4.  5.  6.  ____ Fleet Enema (as directed)   __x__ Use CHG Soap as directed  ____ Use  inhalers on the day of surgery  ____ Stop metformin 2 days prior to surgery    ____ Take 1/2 of usual insulin dose the night before surgery. No insulin the morning          of surgery.   ____ Stop Coumadin/Plavix/aspirin on   _x___ Stop Anti-inflammatories on ibuprofen and Aleve.  May take tylenol up to 4000mg  in 24 hours   __x__ Stop supplements until after surgery. 1 week before  ____ Bring C-Pap to the hospital.

## 2018-12-03 ENCOUNTER — Ambulatory Visit: Payer: Self-pay | Admitting: Anesthesiology

## 2018-12-03 ENCOUNTER — Encounter
Admission: RE | Disposition: A | Discharge status: Home / Self Care | Payer: Self-pay | Source: Home / Self Care | Attending: Neurosurgery

## 2018-12-03 ENCOUNTER — Ambulatory Visit
Admission: RE | Admit: 2018-12-03 | Discharge: 2018-12-03 | Disposition: A | Discharge status: Home / Self Care | Payer: Self-pay | Attending: Neurosurgery | Admitting: Neurosurgery

## 2018-12-03 ENCOUNTER — Other Ambulatory Visit: Payer: Self-pay

## 2018-12-03 ENCOUNTER — Ambulatory Visit: Payer: Self-pay

## 2018-12-03 ENCOUNTER — Encounter: Payer: Self-pay | Admitting: *Deleted

## 2018-12-03 DIAGNOSIS — K219 Gastro-esophageal reflux disease without esophagitis: Secondary | ICD-10-CM | POA: Insufficient documentation

## 2018-12-03 DIAGNOSIS — Z87442 Personal history of urinary calculi: Secondary | ICD-10-CM | POA: Insufficient documentation

## 2018-12-03 DIAGNOSIS — Z87440 Personal history of urinary (tract) infections: Secondary | ICD-10-CM | POA: Insufficient documentation

## 2018-12-03 DIAGNOSIS — F419 Anxiety disorder, unspecified: Secondary | ICD-10-CM | POA: Insufficient documentation

## 2018-12-03 DIAGNOSIS — Z803 Family history of malignant neoplasm of breast: Secondary | ICD-10-CM | POA: Insufficient documentation

## 2018-12-03 DIAGNOSIS — Z79899 Other long term (current) drug therapy: Secondary | ICD-10-CM | POA: Insufficient documentation

## 2018-12-03 DIAGNOSIS — M5416 Radiculopathy, lumbar region: Secondary | ICD-10-CM | POA: Insufficient documentation

## 2018-12-03 DIAGNOSIS — M48061 Spinal stenosis, lumbar region without neurogenic claudication: Secondary | ICD-10-CM | POA: Insufficient documentation

## 2018-12-03 DIAGNOSIS — Z419 Encounter for procedure for purposes other than remedying health state, unspecified: Secondary | ICD-10-CM

## 2018-12-03 HISTORY — PX: LUMBAR LAMINECTOMY/DECOMPRESSION MICRODISCECTOMY: SHX5026

## 2018-12-03 LAB — POCT PREGNANCY, URINE: Preg Test, Ur: NEGATIVE

## 2018-12-03 LAB — ABO/RH: ABO/RH(D): O POS

## 2018-12-03 SURGERY — LUMBAR LAMINECTOMY/DECOMPRESSION MICRODISCECTOMY 1 LEVEL
Anesthesia: General | Laterality: Left

## 2018-12-03 MED ORDER — BUPIVACAINE HCL 0.5 % IJ SOLN: INTRAMUSCULAR | Status: DC | PRN

## 2018-12-03 MED ORDER — BACITRACIN 50000 UNITS IM SOLR
INTRAMUSCULAR | Status: AC
  Filled 2018-12-03: qty 1

## 2018-12-03 MED ORDER — METHOCARBAMOL 500 MG PO TABS: 500.0000 mg | ORAL_TABLET | Freq: Four times a day (QID) | ORAL | 0 refills | Status: DC | PRN

## 2018-12-03 MED ORDER — METHYLPREDNISOLONE ACETATE 40 MG/ML IJ SUSP
INTRAMUSCULAR | Status: AC
  Filled 2018-12-03: qty 1

## 2018-12-03 MED ORDER — THROMBIN 5000 UNITS EX SOLR
CUTANEOUS | Status: DC | PRN
  Administered 2018-12-03: 5000 [IU] via TOPICAL

## 2018-12-03 MED ORDER — ONDANSETRON HCL 4 MG/2ML IJ SOLN
INTRAMUSCULAR | Status: DC | PRN
  Administered 2018-12-03: 4 mg via INTRAVENOUS

## 2018-12-03 MED ORDER — PROPOFOL 10 MG/ML IV BOLUS
INTRAVENOUS | Status: AC
  Filled 2018-12-03: qty 20

## 2018-12-03 MED ORDER — FENTANYL CITRATE (PF) 100 MCG/2ML IJ SOLN
25.0000 ug | INTRAMUSCULAR | Status: DC | PRN
  Administered 2018-12-03 (×4): 50 ug via INTRAVENOUS

## 2018-12-03 MED ORDER — LIDOCAINE-EPINEPHRINE (PF) 1 %-1:200000 IJ SOLN
INTRAMUSCULAR | Status: AC
  Filled 2018-12-03: qty 30

## 2018-12-03 MED ORDER — CEFAZOLIN SODIUM-DEXTROSE 2-4 GM/100ML-% IV SOLN
2.0000 g | Freq: Once | INTRAVENOUS | Status: AC
  Administered 2018-12-03: 2 g via INTRAVENOUS

## 2018-12-03 MED ORDER — SODIUM CHLORIDE FLUSH 0.9 % IV SOLN
INTRAVENOUS | Status: AC
  Filled 2018-12-03: qty 10

## 2018-12-03 MED ORDER — THROMBIN 5000 UNITS EX SOLR
CUTANEOUS | Status: AC
  Filled 2018-12-03: qty 5000

## 2018-12-03 MED ORDER — HYDROCODONE-ACETAMINOPHEN 10-325 MG PO TABS: 1.0000 | ORAL_TABLET | ORAL | 0 refills | Status: AC | PRN

## 2018-12-03 MED ORDER — SUGAMMADEX SODIUM 200 MG/2ML IV SOLN
INTRAVENOUS | Status: DC | PRN
  Administered 2018-12-03: 200 mg via INTRAVENOUS

## 2018-12-03 MED ORDER — BUPIVACAINE-EPINEPHRINE 0.5% -1:200000 IJ SOLN
INTRAMUSCULAR | Status: DC | PRN
  Administered 2018-12-03: 8 mL

## 2018-12-03 MED ORDER — OXYCODONE HCL 5 MG PO TABS
ORAL_TABLET | ORAL | Status: AC
  Filled 2018-12-03: qty 1

## 2018-12-03 MED ORDER — PROPOFOL 10 MG/ML IV BOLUS
INTRAVENOUS | Status: DC | PRN
  Administered 2018-12-03: 150 mg via INTRAVENOUS

## 2018-12-03 MED ORDER — FENTANYL CITRATE (PF) 100 MCG/2ML IJ SOLN
INTRAMUSCULAR | Status: AC
  Filled 2018-12-03: qty 2

## 2018-12-03 MED ORDER — METHYLPREDNISOLONE ACETATE 40 MG/ML IJ SUSP
INTRAMUSCULAR | Status: DC | PRN
  Administered 2018-12-03: 40 mg

## 2018-12-03 MED ORDER — LIDOCAINE HCL (PF) 2 % IJ SOLN
INTRAMUSCULAR | Status: AC
  Filled 2018-12-03: qty 10

## 2018-12-03 MED ORDER — LACTATED RINGERS IV SOLN
INTRAVENOUS | Status: DC
  Administered 2018-12-03: 11:00:00 via INTRAVENOUS

## 2018-12-03 MED ORDER — PROMETHAZINE HCL 25 MG/ML IJ SOLN
INTRAMUSCULAR | Status: AC
  Filled 2018-12-03: qty 1

## 2018-12-03 MED ORDER — MIDAZOLAM HCL 2 MG/2ML IJ SOLN
INTRAMUSCULAR | Status: DC | PRN
  Administered 2018-12-03: 2 mg via INTRAVENOUS

## 2018-12-03 MED ORDER — ROCURONIUM BROMIDE 100 MG/10ML IV SOLN
INTRAVENOUS | Status: DC | PRN
  Administered 2018-12-03: 50 mg via INTRAVENOUS

## 2018-12-03 MED ORDER — OXYCODONE HCL 5 MG/5ML PO SOLN: 5.0000 mg | Freq: Once | ORAL | Status: AC | PRN

## 2018-12-03 MED ORDER — MIDAZOLAM HCL 2 MG/2ML IJ SOLN
INTRAMUSCULAR | Status: AC
  Filled 2018-12-03: qty 2

## 2018-12-03 MED ORDER — BUPIVACAINE HCL (PF) 0.5 % IJ SOLN
INTRAMUSCULAR | Status: AC
  Filled 2018-12-03: qty 30

## 2018-12-03 MED ORDER — PHENYLEPHRINE HCL 10 MG/ML IJ SOLN
INTRAMUSCULAR | Status: DC | PRN
  Administered 2018-12-03 (×4): 100 ug via INTRAVENOUS

## 2018-12-03 MED ORDER — EPINEPHRINE PF 1 MG/ML IJ SOLN
INTRAMUSCULAR | Status: AC
  Filled 2018-12-03: qty 1

## 2018-12-03 MED ORDER — DEXAMETHASONE SODIUM PHOSPHATE 10 MG/ML IJ SOLN
INTRAMUSCULAR | Status: DC | PRN
  Administered 2018-12-03: 8 mg via INTRAVENOUS

## 2018-12-03 MED ORDER — PROMETHAZINE HCL 25 MG/ML IJ SOLN
6.2500 mg | INTRAMUSCULAR | Status: DC | PRN
  Administered 2018-12-03: 12.5 mg via INTRAVENOUS

## 2018-12-03 MED ORDER — CEFAZOLIN SODIUM-DEXTROSE 2-4 GM/100ML-% IV SOLN
INTRAVENOUS | Status: AC
  Filled 2018-12-03: qty 100

## 2018-12-03 MED ORDER — FAMOTIDINE 20 MG PO TABS
20.0000 mg | ORAL_TABLET | Freq: Once | ORAL | Status: AC
  Administered 2018-12-03: 20 mg via ORAL

## 2018-12-03 MED ORDER — SODIUM CHLORIDE 0.9 % IR SOLN
Status: DC | PRN
  Administered 2018-12-03: 14:00:00

## 2018-12-03 MED ORDER — OXYCODONE HCL 5 MG PO TABS
5.0000 mg | ORAL_TABLET | Freq: Once | ORAL | Status: AC | PRN
  Administered 2018-12-03: 5 mg via ORAL

## 2018-12-03 MED ORDER — FENTANYL CITRATE (PF) 100 MCG/2ML IJ SOLN
INTRAMUSCULAR | Status: DC | PRN
  Administered 2018-12-03 (×2): 50 ug via INTRAVENOUS

## 2018-12-03 MED ORDER — FAMOTIDINE 20 MG PO TABS
ORAL_TABLET | ORAL | Status: AC
  Filled 2018-12-03: qty 1

## 2018-12-03 MED ORDER — SODIUM CHLORIDE (PF) 0.9 % IJ SOLN
INTRAMUSCULAR | Status: AC
  Filled 2018-12-03: qty 10

## 2018-12-03 MED ORDER — MEPERIDINE HCL 50 MG/ML IJ SOLN: 6.2500 mg | INTRAMUSCULAR | Status: DC | PRN

## 2018-12-03 MED ORDER — ROCURONIUM BROMIDE 50 MG/5ML IV SOLN
INTRAVENOUS | Status: AC
  Filled 2018-12-03: qty 1

## 2018-12-03 SURGICAL SUPPLY — 58 items
BUR NEURO DRILL SOFT 3.0X3.8M (BURR) ×3 IMPLANT
CANISTER SUCT 1200ML W/VALVE (MISCELLANEOUS) ×6 IMPLANT
CHLORAPREP W/TINT 26ML (MISCELLANEOUS) ×3 IMPLANT
COUNTER NEEDLE 20/40 LG (NEEDLE) ×3 IMPLANT
COVER LIGHT HANDLE STERIS (MISCELLANEOUS) ×6 IMPLANT
COVER WAND RF STERILE (DRAPES) IMPLANT
CUP MEDICINE 2OZ PLAST GRAD ST (MISCELLANEOUS) ×3 IMPLANT
DERMABOND ADVANCED (GAUZE/BANDAGES/DRESSINGS) ×2
DERMABOND ADVANCED .7 DNX12 (GAUZE/BANDAGES/DRESSINGS) ×1 IMPLANT
DRAPE C-ARM 42X72 X-RAY (DRAPES) ×6 IMPLANT
DRAPE LAPAROTOMY 100X77 ABD (DRAPES) ×3 IMPLANT
DRAPE MICROSCOPE SPINE 48X150 (DRAPES) ×3 IMPLANT
DRAPE SURG 17X11 SM STRL (DRAPES) ×3 IMPLANT
DURASEAL APPLICATOR TIP (TIP) IMPLANT
DURASEAL SPINE SEALANT 3ML (MISCELLANEOUS) IMPLANT
ELECT CAUTERY BLADE TIP 2.5 (TIP) ×3
ELECT EZSTD 165MM 6.5IN (MISCELLANEOUS) ×3
ELECT REM PT RETURN 9FT ADLT (ELECTROSURGICAL) ×3
ELECTRODE CAUTERY BLDE TIP 2.5 (TIP) ×1 IMPLANT
ELECTRODE EZSTD 165MM 6.5IN (MISCELLANEOUS) ×1 IMPLANT
ELECTRODE REM PT RTRN 9FT ADLT (ELECTROSURGICAL) ×1 IMPLANT
GAUZE SPONGE 4X4 12PLY STRL (GAUZE/BANDAGES/DRESSINGS) ×3 IMPLANT
GLOVE BIOGEL PI IND STRL 7.0 (GLOVE) ×2 IMPLANT
GLOVE BIOGEL PI IND STRL 8 (GLOVE) ×1 IMPLANT
GLOVE BIOGEL PI INDICATOR 7.0 (GLOVE) ×4
GLOVE BIOGEL PI INDICATOR 8 (GLOVE) ×2
GLOVE SURG SYN 7.0 (GLOVE) ×9 IMPLANT
GLOVE SURG SYN 8.0 (GLOVE) ×9 IMPLANT
GOWN STRL REUS W/ TWL XL LVL3 (GOWN DISPOSABLE) ×1 IMPLANT
GOWN STRL REUS W/TWL MED LVL3 (GOWN DISPOSABLE) ×3 IMPLANT
GOWN STRL REUS W/TWL XL LVL3 (GOWN DISPOSABLE) ×2
GRADUATE 1200CC STRL 31836 (MISCELLANEOUS) ×3 IMPLANT
KIT TURNOVER KIT A (KITS) ×3 IMPLANT
KIT WILSON FRAME (KITS) ×3 IMPLANT
KNIFE BAYONET SHORT DISCETOMY (MISCELLANEOUS) ×3 IMPLANT
MARKER SKIN DUAL TIP RULER LAB (MISCELLANEOUS) ×6 IMPLANT
NDL SAFETY ECLIPSE 18X1.5 (NEEDLE) ×2 IMPLANT
NEEDLE HYPO 18GX1.5 SHARP (NEEDLE) ×4
NEEDLE HYPO 22GX1.5 SAFETY (NEEDLE) ×3 IMPLANT
NS IRRIG 1000ML POUR BTL (IV SOLUTION) ×3 IMPLANT
PACK LAMINECTOMY NEURO (CUSTOM PROCEDURE TRAY) ×3 IMPLANT
PAD ARMBOARD 7.5X6 YLW CONV (MISCELLANEOUS) ×3 IMPLANT
SPOGE SURGIFLO 8M (HEMOSTASIS) ×2
SPONGE SURGIFLO 8M (HEMOSTASIS) ×1 IMPLANT
STAPLER SKIN PROX 35W (STAPLE) IMPLANT
SUT NURALON 4 0 TR CR/8 (SUTURE) IMPLANT
SUT POLYSORB 2-0 5X18 GS-10 (SUTURE) ×6 IMPLANT
SUT POLYSORB 3-0 18 V-20 (SUTURE) ×6 IMPLANT
SUT VIC AB 0 CT1 18XCR BRD 8 (SUTURE) ×1 IMPLANT
SUT VIC AB 0 CT1 8-18 (SUTURE) ×2
SYR 10ML LL (SYRINGE) ×6 IMPLANT
SYR 30ML LL (SYRINGE) ×3 IMPLANT
SYR 3ML LL SCALE MARK (SYRINGE) ×3 IMPLANT
TOWEL OR 17X26 4PK STRL BLUE (TOWEL DISPOSABLE) ×12 IMPLANT
TUBE MATRX SPINL 22MM 6CM DISP (INSTRUMENTS) ×2
TUBE METRX SPINAL 22X6 DISP (INSTRUMENTS) ×1 IMPLANT
TUBING CONNECTING 10 (TUBING) ×2 IMPLANT
TUBING CONNECTING 10' (TUBING) ×1

## 2018-12-03 NOTE — Discharge Instructions (Signed)
°Your surgeon has performed an operation on your lumbar spine (low back) to relieve pressure on one or more nerves. Many times, patients feel better immediately after surgery and can “overdo it.” Even if you feel well, it is important that you follow these activity guidelines. If you do not let your back heal properly from the surgery, you can increase the chance of a disc herniation and/or return of your symptoms. The following are instructions to help in your recovery once you have been discharged from the hospital. ° °* Do not take anti-inflammatory medications for 3 days after surgery (naproxen [Aleve], ibuprofen [Advil, Motrin], celecoxib [Celebrex], etc.) ° °Activity  °  °No bending, lifting, or twisting (“BLT”). Avoid lifting objects heavier than 10 pounds (gallon milk jug).  Where possible, avoid household activities that involve lifting, bending, pushing, or pulling such as laundry, vacuuming, grocery shopping, and childcare. Try to arrange for help from friends and family for these activities while your back heals. ° °Increase physical activity slowly as tolerated.  Taking short walks is encouraged, but avoid strenuous exercise. Do not jog, run, bicycle, lift weights, or participate in any other exercises unless specifically allowed by your doctor. Avoid prolonged sitting, including car rides. ° °Talk to your doctor before resuming sexual activity. ° °You should not drive until cleared by your doctor. ° °Until released by your doctor, you should not return to work or school.  You should rest at home and let your body heal.  ° °You may shower two days after your surgery.  After showering, lightly dab your incision dry. Do not take a tub bath or go swimming for 3 weeks, or until approved by your doctor at your follow-up appointment. ° °If you smoke, we strongly recommend that you quit.  Smoking has been proven to interfere with normal healing in your back and will dramatically reduce the success rate of  your surgery. Please contact [facility] (800-QUIT-NOW) and use the resources at www.[facility].com for assistance in stopping smoking. ° °Surgical Incision °  °If you have a dressing on your incision, you may remove it three days after your surgery. Keep your incision area clean and dry. ° °If you have staples or stitches on your incision, you should have a follow up scheduled for removal. If you do not have staples or stitches, you will have steri-strips (small pieces of surgical tape) or Dermabond glue. The steri-strips/glue should begin to peel away within about a week (it is fine if the steri-strips fall off before then). If the strips are still in place one week after your surgery, you may gently remove them. ° °Diet          ° ° You may return to your usual diet. Be sure to stay hydrated. ° °When to Contact Us ° °Although your surgery and recovery will likely be uneventful, you may have some residual numbness, aches, and pains in your back and/or legs. This is normal and should improve in the next few weeks. ° °However, should you experience any of the following, contact us immediately: °• New numbness or weakness °• Pain that is progressively getting worse, and is not relieved by your pain medications or rest °• Bleeding, redness, swelling, pain, or drainage from surgical incision °• Chills or flu-like symptoms °• Fever greater than 101.0 F (38.3 C) °• Problems with bowel or bladder functions °• Difficulty breathing or shortness of breath °• Warmth, tenderness, or swelling in your calf ° °Contact Information °• During office hours (Monday-Friday   9 am to 5 pm), please call your physician at [phone] °• After hours and weekends, please call the [facility] at [phone] and ask for the Neurosurgery Resident On Call  °• For a life-threatening emergency, call 911 ° °AMBULATORY SURGERY  °DISCHARGE INSTRUCTIONS ° ° °1) The drugs that you were given will stay in your system until tomorrow so for the next 24  hours you should not: ° °A) Drive an automobile °B) Make any legal decisions °C) Drink any alcoholic beverage ° ° °2) You may resume regular meals tomorrow.  Today it is better to start with liquids and gradually work up to solid foods. ° °You may eat anything you prefer, but it is better to start with liquids, then soup and crackers, and gradually work up to solid foods. ° ° °3) Please notify your doctor immediately if you have any unusual bleeding, trouble breathing, redness and pain at the surgery site, drainage, fever, or pain not relieved by medication. ° ° ° °4) Additional Instructions: ° °Please contact your physician with any problems or Same Day Surgery at [phone], Monday through Friday 6 am to 4 pm, or [place] [facility] number at [phone]. ° °

## 2018-12-03 NOTE — Transfer of Care (Signed)
Immediate Anesthesia Transfer of Care Note  Patient: Paige [name]  Procedure(s) Performed: LUMBAR LAMINECTOMY/DECOMPRESSION MICRODISCECTOMY 1 LEVEL L5-S1 (Left )  Patient Location: PACU  Anesthesia Type:General  Level of Consciousness: drowsy  Airway & Oxygen Therapy: Patient Spontanous Breathing and Patient connected to face mask oxygen  Post-op Assessment: Report given to RN, Post -op Vital signs reviewed and stable and Patient moving all extremities X 4  Post vital signs: Reviewed and stable  Last Vitals:  Vitals Value Taken Time  BP 120/84 12/03/2018  3:53 PM  Temp    Pulse 72 12/03/2018  3:52 PM  Resp 16 12/03/2018  3:54 PM  SpO2 87 % 12/03/2018  3:52 PM  Vitals shown include unvalidated device data.  Last Pain:  Vitals:   12/03/18 1051  TempSrc: Temporal  PainSc: 6          Complications: No apparent anesthesia complications

## 2018-12-03 NOTE — Anesthesia Post-op Follow-up Note (Signed)
Anesthesia QCDR form completed.        

## 2018-12-03 NOTE — Anesthesia Preprocedure Evaluation (Signed)
Anesthesia Evaluation  Patient identified by MRN, date of birth, ID band Patient awake    Reviewed: Allergy & Precautions, NPO status , Patient's Chart, lab work & pertinent test results  History of Anesthesia Complications Negative for: history of anesthetic complications  Airway Mallampati: II  TM Distance: >3 FB Neck ROM: Full    Dental no notable dental hx.    Pulmonary neg pulmonary ROS, neg sleep apnea, neg COPD,    breath sounds clear to auscultation- rhonchi (-) wheezing      Cardiovascular Exercise Tolerance: Good (-) hypertension(-) CAD, (-) Past MI, (-) Cardiac Stents and (-) CABG  Rhythm:Regular Rate:Normal - Systolic murmurs and - Diastolic murmurs    Neuro/Psych neg Seizures Anxiety negative neurological ROS     GI/Hepatic negative GI ROS, Neg liver ROS,   Endo/Other  negative endocrine ROSneg diabetes  Renal/GU negative Renal ROS     Musculoskeletal negative musculoskeletal ROS (+)   Abdominal (+) - obese,   Peds  Hematology negative hematology ROS (+)   Anesthesia Other Findings Past Medical History: No date: History of kidney stones No date: Hx: UTI (urinary tract infection)   Reproductive/Obstetrics                             Anesthesia Physical Anesthesia Plan  ASA: II  Anesthesia Plan: General   Post-op Pain Management:    Induction: Intravenous  PONV Risk Score and Plan: 2 and Ondansetron, Dexamethasone and Midazolam  Airway Management Planned: Oral ETT  Additional Equipment:   Intra-op Plan:   Post-operative Plan: Extubation in OR  Informed Consent: I have reviewed the patients History and Physical, chart, labs and discussed the procedure including the risks, benefits and alternatives for the proposed anesthesia with the patient or authorized representative who has indicated his/her understanding and acceptance.     Dental advisory given  Plan  Discussed with: CRNA and Anesthesiologist  Anesthesia Plan Comments:         Anesthesia Quick Evaluation

## 2018-12-03 NOTE — Anesthesia Procedure Notes (Signed)
Procedure Name: Intubation Date/Time: 12/03/2018 1:46 PM Performed by: Rona Ravens, CRNA Pre-anesthesia Checklist: Patient identified, Emergency Drugs available, Suction available and Patient being monitored Patient Re-evaluated:Patient Re-evaluated prior to induction Oxygen Delivery Method: Circle system utilized Preoxygenation: Pre-oxygenation with 100% oxygen Induction Type: IV induction Ventilation: Mask ventilation without difficulty Laryngoscope Size: Mac and 3 Grade View: Grade II Tube type: Oral Tube size: 7.0 mm Number of attempts: 1 Airway Equipment and Method: Stylet Placement Confirmation: ETT inserted through vocal cords under direct vision,  positive ETCO2 and breath sounds checked- equal and bilateral Secured at: 21 cm Tube secured with: Tape Dental Injury: Teeth and Oropharynx as per pre-operative assessment

## 2018-12-03 NOTE — Op Note (Signed)
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 Patridge to clinic on1/21with ongoingleftleg pain. She had failed conservative management including PT, steroid courses, and prescription medications. MRI revealed left L5/S1 stenosis compressingthetraversingnerve root with a small disc herniation.Therisks of surgery were explained to include hematoma, infection, damage to nerve roots, CSF leak, weakness, numbness, pain, need for future surgery including fusion, heart attack, and stroke. She elected to proceed with surgery for symptom relief.   Procedure The patient was brought to the OR after informed consent was obtained.She was given general anesthesia and intubated by the anesthesia service. Vascular access lines were placed.The patient was then placed prone on a Wilson frameensuring all pressure points were padded.Antibiotics were administered.A time-out was performed per protocol.   The patient was sterilely prepped and draped. Fluoroscopy confirmedL5/S1interspaceandanincision was planned1.5cm off midlineon the left.The incision was instilled withlocal anesthetic with epinephrine. The skin was opened sharply and the dissection taken to the fascia. This was incised and initial dilator placedthe spinous processes and lamina of L5on the leftout to the medial edge of the facet. Serial dilatorswere inserted via fluoroscopy and the final41mm tube was placed at depth of 6cm.  The microscope  was brought into the field. The overlying muscle was removed from lamina and medial facet.Next, a matchstickdrill bit was used to remove the L5 lamina centrallyand going laterally.The underlying ligament was freed and removed with combination of rongeurs. The decompression was taken caudal to the superior border ofS1and then the medial facet was identified. The dura was seen to be full and intact.Once all ligament and soft tissue was removed, attention was turned to inspection of the nerve root. The nerve root and thecal sacwas retracted and there was a small disc bulge seen. The disc space was coagulated and then entered sharply. There was egress of soft disc material which was removed. Next, curettes and blunt probes were used to remove all free disc material below the PLL. Once no disc was seen and disc space flat to level of surrounding endplates, the disc space was irrigated. The epidural space was palpated with blunt probe and found to have no compression. The S1 nerve root was seen to be free throughout the exposure.   Hemostasis was achieved with cautery and floseal. The wound was irrigated.  Solumedrol was then placed along the traversing nerve root. The microscope was removed.  Themuscle andfasciawas then closed using 2-0 vicryl followed by thesubcutaneous and dermal layers with 2-0 vicryl and 3-0 vicryluntil the epidermis was well approximated. The skin was closed with Dermabond. A dressing was applied.  The patient was returned to supine position and extubated by the anesthesia service. The patient was then taken to the PACU for post-operative care whereshe was moving extremities symmetrically.   ESTIMATED BLOOD LOSS: 20cc  SPECIMENS None  IMPLANT None   I performed the case in its entiretywith assistance of PA, Dorian Furnace, MD [phone]

## 2018-12-03 NOTE — Discharge Summary (Signed)
Procedure: L5-S1 lumbar decompression Procedure date: 12/03/2018 Diagnosis: Lumbar radiculopathy  History: Paige [name] is POD0 s/p L5-S1 decompression for lumbar radiculopathy.  Tolerated procedure well without complication.  Evaluated postoperatively still disoriented from anesthesia but able to answer questions and obey commands.  Pain symptoms that were present prior to surgery have resolved.  Denies any lower extremity pain/numbness/tingling.  Physical Exam: Vitals:   12/03/18 1051  BP: 136/86  Pulse: 86  Resp: 16  Temp: 98.6 F (37 C)  SpO2: 100%   Strength:5/5 throughout upper and lower extremities Sensation: Intact and symmetric throughout upper and lower extremities Skin: Glue intact at incision site.  Data:  No results for input(s): NA, K, CL, CO2, BUN, CREATININE, LABGLOM, GLUCOSE, CALCIUM in the last 168 hours. No results for input(s): AST, ALT, ALKPHOS in the last 168 hours.  Invalid input(s): TBILI   No results for input(s): WBC, HGB, HCT, PLT in the last 168 hours. No results for input(s): APTT, INR in the last 168 hours.       Other tests/results: No imaging reviewed  Assessment/Plan:  Paige [name] is POD 0 s/p L5-S1 lumbar decompression for lumbar radiculopathy.  Symptoms that were present prior to surgery have resolved.  Neuro intact.  We will continue pain control with Tylenol, ibuprofen, Norco, Robaxin as needed.  She is scheduled to follow-up in clinic in approximately 2 weeks to monitor progress.  Ivar DrapeAmanda Saralynn Langhorst PA-C Department of Neurosurgery

## 2018-12-03 NOTE — Progress Notes (Signed)
Cefazolin (ANcef) dosing per Pharmacy Consult  43 yo F  Wt 71.8 kg  Will order Cefazolin 2 gram IV x1 for surgical prophylaxis  Bari Mantis PharmD Clinical Pharmacist 12/03/2018

## 2018-12-03 NOTE — H&P (Signed)
Paige [name] is an 43 y.o. female.   Chief Complaint: Left leg pain HPI:  Paige [name] is here for evaluation of ongoing symptoms of left leg pain. She just has been having some symptoms in the right leg but these are to a lesser degree. She states the symptoms been going on for about a year and consist also of numbness and tingling in the same distributions. The pain will mostly go into the left buttocks area down the posterior thigh into the foot. She is also having some pain across the low back. She is undergone multiple sessions of therapy and massage therapy without relief. She is attempted multiple rounds of oral steroids without relief. She is currently on one now. She is tried multiple medications in the past. She does not note any obvious weakness but she does states she is dragging her left foot now. She underwent a recent MRI showing left L5/S1 stenosis. We discussed surgery for decompression and she wishes to proceed   Past Medical History:  Diagnosis Date  . History of kidney stones   . Hx: UTI (urinary tract infection)     Past Surgical History:  Procedure Laterality Date  . TUBAL LIGATION  2011    Family History  Problem Relation Age of Onset  . BRCA 1/2 Mother        tested positive for the BRCA Gene  . Breast cancer Maternal Aunt 66  . Breast cancer Maternal Grandmother 36  . Thyroid disease Neg Hx    Social History:  reports that she has never smoked. She has never used smokeless tobacco. She reports that she does not drink alcohol or use drugs.  Allergies: No Known Allergies  Medications Prior to Admission  Medication Sig Dispense Refill  . acetaminophen (TYLENOL) 500 MG tablet Take 500 mg by mouth every 6 (six) hours as needed for moderate pain.    Marland Kitchen HYDROcodone-acetaminophen (NORCO) 10-325 MG tablet Take 1 tablet by mouth every 6 (six) hours as needed. (Patient taking differently: Take 0.5 tablets by mouth daily as needed for severe pain. ) 30 tablet 0  .  ondansetron (ZOFRAN ODT) 4 MG disintegrating tablet Take 1 tablet (4 mg total) by mouth every 8 (eight) hours as needed for nausea or vomiting. (Patient not taking: Reported on 11/23/2018) 20 tablet 0  . traMADol (ULTRAM) 50 MG tablet Take 2 tablets (100 mg total) by mouth every 6 (six) hours as needed. 180 tablet 0    Results for orders placed or performed during the hospital encounter of 12/03/18 (from the past 48 hour(s))  Pregnancy, urine POC     Status: None   Collection Time: 12/03/18 10:50 AM  Result Value Ref Range   Preg Test, Ur NEGATIVE NEGATIVE    Comment:        THE SENSITIVITY OF THIS METHODOLOGY IS >24 mIU/mL   ABO/Rh     Status: None   Collection Time: 12/03/18 11:05 AM  Result Value Ref Range   ABO/RH(D)      O POS Performed at [facility], [address] [place], [place] [address]    No results found.  ROS General ROS: Negative Psychological ROS: Negative Ophthalmic ROS: Negative ENT ROS: Negative Hematological and Lymphatic ROS: Negative  Endocrine ROS: Negative Respiratory ROS: Negative Cardiovascular ROS: Negative Gastrointestinal ROS: Negative Genito-Urinary ROS: Negative Musculoskeletal ROS: Positive for back pain Neurological ROS: Positive for leg pain and numbness Dermatological ROS: Negative   Blood pressure 136/86, pulse 86, temperature 98.6  F (37 C), temperature source Temporal, resp. rate 16, height 5' 7" (1.702 m), weight 71.8 kg, SpO2 100 %. Physical Exam  General appearance: Alert, cooperative, in no acute distress Head: Normocephalic, atraumatic Eyes: Normal, EOM intact Oropharynx: Moist without lesions Back: No tenderness to palpation over the midline or paramedian regions Ext: No edema in LE bilaterally, extremities CV: Regular rate Pulm: Clear to auscultation   Neurologic exam:  Mental status: alertness: alert, affect: normal Speech: fluent and clear Motor:strength symmetric 5/5 in bilateral hip flexion, knee  flexion, knee extension with dorsiflexion, plantarflexion Sensory: intact to light touch in bilateral lower extremities Reflexes: 2+ and symmetric bilaterally for patella Gait: normal   MRI lumbar spine: There is a normal lordotic curvature with overall normal alignment. To space heights appear well-preserved. There is a disc herniation that is eccentric to the left at L5-S1 resulting in stenosis of the lateral recess and foramen. There are no other levels of significant stenosis noted  Assessment/Plan left L5-S1 hemilaminectomy discectomy   [name], MD 12/03/2018, 12:55 PM

## 2018-12-04 ENCOUNTER — Encounter: Payer: Self-pay | Admitting: Neurosurgery

## 2018-12-06 NOTE — Anesthesia Postprocedure Evaluation (Signed)
Anesthesia Post Note  Patient: Paige [name]  Procedure(s) Performed: LUMBAR LAMINECTOMY/DECOMPRESSION MICRODISCECTOMY 1 LEVEL L5-S1 (Left )  Patient location during evaluation: PACU Anesthesia Type: General Level of consciousness: awake and alert and oriented Pain management: pain level controlled Vital Signs Assessment: post-procedure vital signs reviewed and stable Respiratory status: spontaneous breathing Cardiovascular status: blood pressure returned to baseline Anesthetic complications: no     Last Vitals:  Vitals:   12/03/18 1704 12/03/18 1747  BP: 128/87 103/74  Pulse: 65 76  Resp: 16 18  Temp: 36.5 C   SpO2: 100% 96%    Last Pain:  Vitals:   12/04/18 0925  TempSrc:   PainSc: 8                  [name]

## 2019-01-02 ENCOUNTER — Ambulatory Visit: Payer: MEDICAID | Attending: Oncology | Admitting: *Deleted

## 2019-01-02 ENCOUNTER — Other Ambulatory Visit: Payer: Self-pay

## 2019-01-02 VITALS — BP 104/71 | HR 83 | Temp 98.0°F | Ht 65.5 in | Wt 157.7 lb

## 2019-01-02 DIAGNOSIS — N63 Unspecified lump in unspecified breast: Secondary | ICD-10-CM

## 2019-01-02 NOTE — Progress Notes (Signed)
  Subjective:     Patient ID: Paige [name], female   DOB: January 22, 1976, 43 y.o.   MRN: [id number]  HPI   Review of Systems     Objective:   Physical Exam Chest:     Breasts:        Right: No swelling, bleeding, inverted nipple, mass, nipple discharge, skin change or tenderness.        Left: No swelling, bleeding, inverted nipple, mass, nipple discharge, skin change or tenderness.    Lymphadenopathy:     Upper Body:     Right upper body: No supraclavicular or axillary adenopathy.     Left upper body: No supraclavicular or axillary adenopathy.        Assessment:     Patient returns to [facility] today for repeat clinical breast exam.  Patient was here on 10/03/18 and I palpated breast masses at 4:00 right breast and 6:00 left breast.  Mammogram and ultrasound at that time were a birads 1.  Patient opted to return for repeat clinical breast exam rather than a surgical consult.  On clinical breast exam today bilateral breast have a diffuse fibroglandular pattern.  There is no dominant mass, skin changes, nipple discharge or lymphadenopathy.  Taught self breast awareness. Patient has been screened for eligibility.  She does not have any insurance, Medicare or Medicaid.  She also meets financial eligibility.  Hand-out given on the Affordable Care Act.  Risk Assessment    No risk assessment data for the current encounter   Risk Scores      10/03/2018   Last edited by: Jim Like, RN   5-year risk: 1.3 %   Lifetime risk: 15.9 %          Plan:     She is to call if she palpates any changes, otherwise she is to follow up in December with her next mammogram and BCCCP clinical exam.  Pap in December 2019 was negative / negative.  Next pap due in 5 years.

## 2019-06-17 ENCOUNTER — Encounter: Payer: Self-pay | Admitting: Physician Assistant

## 2019-06-17 ENCOUNTER — Telehealth: Payer: Self-pay | Admitting: Physician Assistant

## 2019-06-17 DIAGNOSIS — N390 Urinary tract infection, site not specified: Secondary | ICD-10-CM

## 2019-06-17 MED ORDER — NITROFURANTOIN MONOHYD MACRO 100 MG PO CAPS: 100.0000 mg | ORAL_CAPSULE | Freq: Two times a day (BID) | ORAL | 0 refills | Status: DC

## 2019-06-17 NOTE — Progress Notes (Signed)
We are sorry that you are not feeling well.  Here is how we plan to help!  Based on what you shared with me it looks like you most likely have a simple urinary tract infection.  A UTI (Urinary Tract Infection) is a bacterial infection of the bladder.  Most cases of urinary tract infections are simple to treat but a key part of your care is to encourage you to drink plenty of fluids and watch your symptoms carefully.  I have prescribed MacroBid 100 mg twice a day for 5 days.  Your symptoms should gradually improve. Call us if the burning in your urine worsens, you develop worsening fever, back pain or pelvic pain or if your symptoms do not resolve after completing the antibiotic.  Urinary tract infections can be prevented by drinking plenty of water to keep your body hydrated.  Also be sure when you wipe, wipe from front to back and don't hold it in!  If possible, empty your bladder every 4 hours.  If you develop any nausea, vomiting, fever, or back pain, please have a face to face evaluation of your symptoms.   Your e-visit answers were reviewed by a board certified advanced clinical practitioner to complete your personal care plan.  Depending on the condition, your plan could have included both over the counter or prescription medications.  If there is a problem please reply  once you have received a response from your provider.  Your safety is important to [place].  If you have drug allergies check your prescription carefully.    You can use MyChart to ask questions about today's visit, request a non-urgent call back, or ask for a work or school excuse for 24 hours related to this e-Visit. If it has been greater than 24 hours you will need to follow up with your provider, or enter a new e-Visit to address those concerns.   You will get an e-mail in the next two days asking about your experience.  I hope that your e-visit has been valuable and will speed your recovery. Thank you for using  e-visits.   I spent 5-10 minutes on review and completion of this note- Lacy Duverney [facility]

## 2019-07-24 ENCOUNTER — Ambulatory Visit: Payer: Self-pay | Admitting: Internal Medicine

## 2019-08-06 ENCOUNTER — Other Ambulatory Visit: Payer: Self-pay

## 2019-08-08 ENCOUNTER — Encounter: Payer: Self-pay | Admitting: Internal Medicine

## 2019-08-08 ENCOUNTER — Other Ambulatory Visit: Payer: Self-pay

## 2019-08-08 ENCOUNTER — Ambulatory Visit (INDEPENDENT_AMBULATORY_CARE_PROVIDER_SITE_OTHER): Payer: Self-pay | Admitting: Internal Medicine

## 2019-08-08 DIAGNOSIS — F411 Generalized anxiety disorder: Secondary | ICD-10-CM

## 2019-08-08 DIAGNOSIS — F331 Major depressive disorder, recurrent, moderate: Secondary | ICD-10-CM

## 2019-08-08 MED ORDER — ESCITALOPRAM OXALATE 10 MG PO TABS: 10.0000 mg | ORAL_TABLET | Freq: Every day | ORAL | 2 refills | Status: DC

## 2019-08-08 MED ORDER — BUPROPION HCL 75 MG PO TABS: 75.0000 mg | ORAL_TABLET | Freq: Two times a day (BID) | ORAL | 1 refills | Status: DC

## 2019-08-08 NOTE — Patient Instructions (Signed)
Start wellbutrin for the depression,  Fatigue,  Poor concentration  75 mg starting dose,  Twice daily  (early morning and nd dose by 3 pm to avoid nighttime issues)  ALSO STARTING LEXAPRO  Dailiy after dinner  This is for the anxiety and the menopause symptoms   SCHEDULE A 15 MINUTE WALK A MINIMUM OF 3 TIMES PER WEEK  BRING HOME A COOKED DINNER AT LEAST TWICE WEEKLY  DON'T SKIP BREAKFAST!    try a premixed protein drink called Premier Protein shake for breakfast. It is great tasting,   very low sugar and available of < $2 serving at [facility] and  In bulk for $1.50/serving at [employer] and [employer]  .    Nutritional analysis :  160 cal  30 g protein  1 g sugar 50% calcium needs   Vladimir Faster and [employer]   Follow up in 2 weeks

## 2019-08-08 NOTE — Progress Notes (Signed)
Virtual Visit via [email or web address]  This visit type was conducted due to national recommendations for restrictions regarding the COVID-19 pandemic (e.g. social distancing).  This format is felt to be most appropriate for this patient at this time.  All issues noted in this document were discussed and addressed.  No physical exam was performed (except for noted visual exam findings with Video Visits).   I connected with@ on 08/08/19 at 11:30 AM EDT by a video enabled telemedicine application  and verified that I am speaking with the correct person using two identifiers. Location patient: home Location provider: work or home office Persons participating in the virtual visit: patient, provider  I discussed the limitations, risks, security and privacy concerns of performing an evaluation and management service by telephone and the availability of in person appointments. I also discussed with the patient that there may be a patient responsible charge related to this service. The patient expressed understanding and agreed to proceed.   Reason for visit: major depressive disorder complicated by anxiety   HPI:  43 yr old female with history of anxiety  presents with positive depression/anxiety screen .  Patient states that for the past several months she has been increasingly irritable with family,  Forgetful , and too fatigued to exercise but is frustrated by her continual weight gain.  She believes she is premenopausal because she has developed menstrual irregularity with only spotting in the last month,  And new onset night sweats.  She has 90 minutes of sleep latency every night , reports racing thoughts at night only,  And is a light sleeper,  And gets up twice to void.  She is averaging about 4.5 to 5 hours of sleep .  She feels that she is so sluggish in the morning that she skips breakfast because it takes her too long to get herself and her son ready for the day. 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    Stressors:  She states that her 68 yr old daughter was recently hospitalized for a drug overdose,  And her brother committed suicide last year .  Her  64 yr old son has ADD and is under treatment.  Husband has ADD but is not treated due to failure to follow up.  She is not suicidal,  But she admits that she was so depressed recently  that she told her husband she thought that she would be better off dead .   Last seen December 01, 2018 for sciatica , underwent neurosurgery with Left L5-S1 hemilaminectomy and discectomy on Dec 03 2018 with relief of sciatic pain.    Treated last in April 2019 for anxiety . Trial of zoloft made her too tired      ROS: See pertinent positives and negatives per HPI.  Past Medical History:  Diagnosis Date  . History of kidney stones   . Hx: UTI (urinary tract infection)     Past Surgical History:  Procedure Laterality Date  . LUMBAR LAMINECTOMY/DECOMPRESSION MICRODISCECTOMY Left 12/03/2018   Procedure: LUMBAR LAMINECTOMY/DECOMPRESSION MICRODISCECTOMY 1 LEVEL L5-S1;  Surgeon: [name], MD;  Location: [facility] ORS;  Service: Neurosurgery;  Laterality: Left;  . TUBAL LIGATION  2011    Family History  Problem Relation Age of Onset  . BRCA 1/2 Mother        tested positive for the BRCA Gene  . Breast cancer Maternal Aunt 66  . Breast cancer Maternal Grandmother 83  . Thyroid disease Neg Hx  SOCIAL HX:  reports that she has never smoked. She has never used smokeless tobacco. She reports that she does not drink alcohol or use drugs.   Current Outpatient Medications:  .  acetaminophen (TYLENOL) 500 MG tablet, Take 500 mg by mouth every 6 (six) hours as needed for moderate pain., Disp: , Rfl:  .  buPROPion (WELLBUTRIN) 75 MG tablet, Take 1 tablet (75 mg total) by mouth 2 (two) times daily., Disp: 180 tablet, Rfl: 1 .  escitalopram (LEXAPRO) 10 MG tablet, Take 1 tablet (10 mg total) by mouth daily., Disp: 30 tablet, Rfl:  2  EXAM:  VITALS per patient if applicable:  GENERAL: alert, oriented, appears well and in no acute distress  HEENT: atraumatic, conjunttiva clear, no obvious abnormalities on inspection of external nose and ears  NECK: normal movements of the head and neck  LUNGS: on inspection no signs of respiratory distress, breathing rate appears normal, no obvious gross SOB, gasping or wheezing  CV: no obvious cyanosis  MS: moves all visible extremities without noticeable abnormality  PSYCH/NEURO: pleasant and cooperative, no obvious depression or anxiety, speech and thought processing grossly intact  ASSESSMENT AND PLAN:  Discussed the following assessment and plan:  Anxiety state  Moderate episode of recurrent major depressive disorder (HCC)  Anxiety state Starting lexapro 10 mg daily after dinner. counselling given regarding need for pleaure activities,  Daily exercise and incread assistance from husband or other sources   Major depressive disorder with current active episode Starting wellbutrin for symptoms of fatigue, anhedonia and poor concentration.  Adding lexapro for insomnia and irritability suggestive of concurrent untreated anxiety disorder .  follow up 2 weeks     I discussed the assessment and treatment plan with the patient. The patient was provided an opportunity to ask questions and all were answered. The patient agreed with the plan and demonstrated an understanding of the instructions.   The patient was advised to call back or seek an in-person evaluation if the symptoms worsen or if the condition fails to improve as anticipated.  A total of 40 minutes of non face to face time was spent with patient more than half of which was spent in counseling patient on the above mentioned issues , reviewing past treatments and labs done, and coordination of care.  Crecencio Mc, MD

## 2019-08-10 DIAGNOSIS — F329 Major depressive disorder, single episode, unspecified: Secondary | ICD-10-CM | POA: Insufficient documentation

## 2019-08-10 NOTE — Assessment & Plan Note (Addendum)
Starting lexapro 10 mg daily after dinner. counselling given regarding need for pleaure activities,  Daily exercise and incread assistance from husband or other sources

## 2019-08-10 NOTE — Assessment & Plan Note (Addendum)
Starting wellbutrin for symptoms of fatigue, anhedonia and poor concentration.  Adding lexapro for insomnia and irritability suggestive of concurrent untreated anxiety disorder .  follow up 2 weeks

## 2019-08-30 ENCOUNTER — Other Ambulatory Visit: Payer: Self-pay | Admitting: Internal Medicine

## 2019-09-18 ENCOUNTER — Telehealth: Payer: Self-pay | Admitting: Family

## 2019-09-18 DIAGNOSIS — R399 Unspecified symptoms and signs involving the genitourinary system: Secondary | ICD-10-CM

## 2019-09-18 MED ORDER — CEPHALEXIN 500 MG PO CAPS: 500.0000 mg | ORAL_CAPSULE | Freq: Two times a day (BID) | ORAL | 0 refills | Status: DC

## 2019-09-18 NOTE — Progress Notes (Signed)

## 2020-01-23 ENCOUNTER — Other Ambulatory Visit: Payer: Self-pay | Admitting: Internal Medicine

## 2020-01-29 ENCOUNTER — Other Ambulatory Visit: Payer: Self-pay | Admitting: Internal Medicine

## 2020-03-31 ENCOUNTER — Encounter: Payer: Self-pay | Admitting: *Deleted

## 2020-03-31 ENCOUNTER — Ambulatory Visit
Admission: RE | Admit: 2020-03-31 | Discharge: 2020-03-31 | Disposition: A | Discharge status: Home / Self Care | Payer: Self-pay | Source: Ambulatory Visit | Attending: Oncology | Admitting: Oncology

## 2020-03-31 ENCOUNTER — Ambulatory Visit: Payer: Self-pay | Attending: Oncology | Admitting: *Deleted

## 2020-03-31 ENCOUNTER — Other Ambulatory Visit: Payer: Self-pay

## 2020-03-31 VITALS — BP 113/75 | HR 82 | Temp 98.1°F | Ht 65.0 in | Wt 160.6 lb

## 2020-03-31 DIAGNOSIS — Z0000 Encounter for general adult medical examination without abnormal findings: Secondary | ICD-10-CM

## 2020-03-31 NOTE — Progress Notes (Signed)
  Subjective:     Patient ID: Paige [name], female   DOB: [birth year], 44 y.o.   MRN: [id number]  HPI   Review of Systems     Objective:   Physical Exam Chest:     Breasts:        Right: No swelling, bleeding, inverted nipple, mass, nipple discharge, skin change or tenderness.        Left: No swelling, bleeding, inverted nipple, mass, nipple discharge, skin change or tenderness.  Lymphadenopathy:     Upper Body:     Right upper body: No supraclavicular or axillary adenopathy.     Left upper body: No supraclavicular or axillary adenopathy.        Assessment:     44 year old White female returns to [facility] for annual screening.  Clinical breast exam unremarkable.  Taught self breast awareness.  Patient with family history of breast and ovarian cancer.  Patient's mom is BRCA positive, but patient states she is BRCA negative.  She was encouraged to be vigilant with her annual screenings.  She is agreeable.  Last pap on 10/03/18 was negative / negative.  Next pap due in 2024.  Patient has been screened for eligibility.  She does not have any insurance, Medicare or Medicaid.  She also meets financial eligibility.   Risk Assessment    Risk Scores      03/31/2020 10/03/2018   Last edited by: Theodore Demark, RN Rico Junker, RN   5-year risk: 1.4 % 1.3 %   Lifetime risk: 15.7 % 15.9 %            Plan:     Screening mammogram ordered.  Will follow up per [facility] protocol.

## 2020-03-31 NOTE — Patient Instructions (Signed)
Gave patient hand-out, Women Staying Healthy, Active and Well from [facility], with education on breast health, pap smears, heart and colon health. 

## 2020-04-02 ENCOUNTER — Encounter: Payer: Self-pay | Admitting: *Deleted

## 2020-04-02 NOTE — Progress Notes (Signed)
Letter mailed from the [facility] to inform patient of her normal mammogram results.  Patient is to follow-up with annual screening in one year. 

## 2020-06-28 ENCOUNTER — Telehealth: Payer: Self-pay | Admitting: Family

## 2020-06-28 DIAGNOSIS — R399 Unspecified symptoms and signs involving the genitourinary system: Secondary | ICD-10-CM

## 2020-06-28 MED ORDER — CEPHALEXIN 500 MG PO CAPS: 500.0000 mg | ORAL_CAPSULE | Freq: Two times a day (BID) | ORAL | 0 refills | Status: DC

## 2020-06-28 NOTE — Progress Notes (Signed)

## 2020-07-07 ENCOUNTER — Telehealth: Payer: Self-pay | Admitting: Internal Medicine

## 2020-07-07 DIAGNOSIS — U071 COVID-19: Secondary | ICD-10-CM

## 2020-07-07 NOTE — Telephone Encounter (Signed)
Pt called in and said she took the home test and is covid positive. She said she spoke with someone earlier and they told her to call back with the results and we would let her know her next steps. I didn't see this message. She also mentioned having the monoclonal antibodies treatment done? She would like a call back.

## 2020-07-07 NOTE — Telephone Encounter (Signed)
Please refer to the monoclonal ab infusion clinic and set her up a virtual visit with me

## 2020-07-07 NOTE — Telephone Encounter (Signed)
Fatigue, sinus pressure, HA, cough. Pt was exposed to positive covid on 06/27/2020. Pt noticed her symptoms started on 07/03/2020.

## 2020-07-07 NOTE — Telephone Encounter (Signed)
Mychart covid-19 home monitoring has been ordered for pt.

## 2020-07-08 ENCOUNTER — Encounter: Payer: Self-pay | Admitting: Internal Medicine

## 2020-07-08 ENCOUNTER — Telehealth (INDEPENDENT_AMBULATORY_CARE_PROVIDER_SITE_OTHER): Payer: Self-pay | Admitting: Internal Medicine

## 2020-07-08 ENCOUNTER — Other Ambulatory Visit: Payer: Self-pay

## 2020-07-08 DIAGNOSIS — U071 COVID-19: Secondary | ICD-10-CM | POA: Insufficient documentation

## 2020-07-08 MED ORDER — HYDROCOD POLST-CPM POLST ER 10-8 MG/5ML PO SUER: 5.0000 mL | Freq: Every evening | ORAL | 0 refills | Status: DC | PRN

## 2020-07-08 NOTE — Patient Instructions (Signed)
YOU HAVE A COVID INFECTION :   1) Consider buying a pulse oximeter at [facility] to monitor your oxygen level.  If your oxygen level falls below 90% and stays there,  Return to the ER  For urgent assistance.    2) continue isolation for a minimum of 10 days.  All symptoms should be improving or resolved in order to break quarantine     3) you must be fever free without the use of tylenol, aleve or motrin for 72 hours  before breaking isolation  and/or returning to work .   Use Robitussin DM or Delsym for the cough. If this does not control your cough I have sent a STRONG cough medicine to [facility]  .  It has hydrocodone in it so it WILL make you sleepy and possibly CONSTIPATED,  SO USE A LAXATIVE NIGHTLY .

## 2020-07-08 NOTE — Assessment & Plan Note (Addendum)
Diagnosed with home test on Sept 14,  Symptoms developed on Sept 11 after spending the week at a vacation home with a family whose wife had been quarantining and subsequently all tested positive .   Husband was also sick and has not sought medical attention and very likely did not quarantine appropriately. Son is 46 yrs old,  Coughing but tested negative.   Parameters for ending quarantine outlined.  Referral to infusion center made.

## 2020-07-08 NOTE — Progress Notes (Signed)
Virtual Visit via [place]  This visit type was conducted due to national recommendations for restrictions regarding the COVID-19 pandemic (e.g. social distancing).  This format is felt to be most appropriate for this patient at this time.  All issues noted in this document were discussed and addressed.  No physical exam was performed (except for noted visual exam findings with Video Visits).   I connected with@ on 07/08/20 at  2:30 PM EDT by a video enabled telemedicine application or telephone and verified that I am speaking with the correct person using two identifiers. Location patient: home Location provider: work or home office Persons participating in the virtual visit: patient, provider  I discussed the limitations, risks, security and privacy concerns of performing an evaluation and management service by telephone and the availability of in person appointments. I also discussed with the patient that there may be a patient responsible charge related to this service. The patient expressed understanding and agreed to proceed.   Reason for visit: COVID POSITIVE TEST   HPI:  44 yr old unvaccinated female presents with [place] 14.  PATIENT DEVELOPED cough, body aches, headache, and sinus s congestion on Sunday after being exposed during the prior week to a family who all tested positive on Sunday .  She has been taking tylenol and motrin for the body aches.  The cough is productive but she denies shortness of breath and wheezing   ROS: See pertinent positives and negatives per HPI.  Past Medical History:  Diagnosis Date  . History of kidney stones   . Hx: UTI (urinary tract infection)     Past Surgical History:  Procedure Laterality Date  . LUMBAR LAMINECTOMY/DECOMPRESSION MICRODISCECTOMY Left 12/03/2018   Procedure: LUMBAR LAMINECTOMY/DECOMPRESSION MICRODISCECTOMY 1 LEVEL L5-S1;  Surgeon: [name], MD;  Location: [facility] ORS;  Service: Neurosurgery;   Laterality: Left;  . TUBAL LIGATION  2011    Family History  Problem Relation Age of Onset  . BRCA 1/2 Mother        tested positive for the BRCA Gene  . Breast cancer Maternal Aunt 34  . Breast cancer Maternal Grandmother 20  . Thyroid disease Neg Hx     SOCIAL HX:  reports that she has never smoked. She has never used smokeless tobacco. She reports that she does not drink alcohol and does not use drugs.   Current Outpatient Medications:  .  acetaminophen (TYLENOL) 500 MG tablet, Take 500 mg by mouth every 6 (six) hours as needed for moderate pain., Disp: , Rfl:  .  buPROPion (WELLBUTRIN) 75 MG tablet, TAKE 1 TABLET BY MOUTH TWICE A DAY, Disp: 180 tablet, Rfl: 1 .  escitalopram (LEXAPRO) 10 MG tablet, TAKE 1 TABLET BY MOUTH EVERY DAY, Disp: 90 tablet, Rfl: 2 .  cephALEXin (KEFLEX) 500 MG capsule, Take 1 capsule (500 mg total) by mouth 2 (two) times daily. (Patient not taking: Reported on 07/08/2020), Disp: 14 capsule, Rfl: 0 .  chlorpheniramine-HYDROcodone (TUSSIONEX PENNKINETIC ER) 10-8 MG/5ML SUER, Take 5 mLs by mouth at bedtime as needed., Disp: 140 mL, Rfl: 0  EXAM:  VITALS per patient if applicable:  GENERAL: alert, oriented, appears sick but not in  acute distress  HEENT: atraumatic, conjunttiva clear, no obvious abnormalities on inspection of external nose and ears  NECK: normal movements of the head and neck  LUNGS: on inspection no signs of respiratory distress, breathing rate appears normal, no obvious gross SOB, gasping or wheezing  CV: no obvious  cyanosis  MS: moves all visible extremities without noticeable abnormality  PSYCH/NEURO: pleasant and cooperative, no obvious depression or anxiety, speech and thought processing grossly intact  ASSESSMENT AND PLAN:  Discussed the following assessment and plan:  COVID-19 virus infection  COVID-19 virus infection Diagnosed with home test on Sept 14,  Symptoms developed on Sept 11 after spending the week at a  vacation home with a family whose wife had been quarantining and subsequently tested positive .   Husband was also sick and has not sought medical attention and very likely did not quarantine appropriately. Son is 26 yrs old,  Coughing but tested negative.      I discussed the assessment and treatment plan with the patient. The patient was provided an opportunity to ask questions and all were answered. The patient agreed with the plan and demonstrated an understanding of the instructions.   The patient was advised to call back or seek an in-person evaluation if the symptoms worsen or if the condition fails to improve as anticipated.  I provided 20 minutes of face-to-face time during this encounter.   Crecencio Mc, MD

## 2020-07-08 NOTE — Telephone Encounter (Signed)
Virtual visit with Dr. Darrick Huntsman has already been scheduled. Pt has been referred for infusion.

## 2020-07-08 NOTE — Telephone Encounter (Signed)
Pt is aware she has been referred for infusion.

## 2020-07-09 ENCOUNTER — Other Ambulatory Visit (HOSPITAL_COMMUNITY): Payer: Self-pay | Admitting: Nurse Practitioner

## 2020-07-09 DIAGNOSIS — U071 COVID-19: Secondary | ICD-10-CM

## 2020-07-09 NOTE — Progress Notes (Signed)
I connected by phone with Paige [name] on 07/09/2020 at 12:10 PM to discuss the potential use of a new treatment for mild to moderate COVID-19 viral infection in non-hospitalized patients.  This patient is a 44 y.o. female that meets the FDA criteria for Emergency Use Authorization of COVID monoclonal antibody casirivimab/imdevimab.  Has a (+) direct SARS-CoV-2 viral test result (home test positive)  Has mild or moderate COVID-19   Is NOT hospitalized due to COVID-19  Is within 10 days of symptom onset 07/05/20  Has at least one of the high risk factor(s) for progression to severe COVID-19 and/or hospitalization as defined in EUA.  Specific high risk criteria : BMI > 25   I have spoken and communicated the following to the patient or parent/caregiver regarding COVID monoclonal antibody treatment:  1. FDA has authorized the emergency use for the treatment of mild to moderate COVID-19 in adults and pediatric patients with positive results of direct SARS-CoV-2 viral testing who are 34 years of age and older weighing at least 40 kg, and who are at high risk for progressing to severe COVID-19 and/or hospitalization.  2. The significant known and potential risks and benefits of COVID monoclonal antibody, and the extent to which such potential risks and benefits are unknown.  3. Information on available alternative treatments and the risks and benefits of those alternatives, including clinical trials.  4. Patients treated with COVID monoclonal antibody should continue to self-isolate and use infection control measures (e.g., wear mask, isolate, social distance, avoid sharing personal items, clean and disinfect "high touch" surfaces, and frequent handwashing) according to CDC guidelines.   5. The patient or parent/caregiver has the option to accept or refuse COVID monoclonal antibody treatment.  After reviewing this information with the patient, The patient agreed to proceed with receiving  casirivimab\imdevimab infusion and will be provided a copy of the Fact sheet prior to receiving the infusion. Jake Samples Pickenpack-Cousar 07/09/2020 12:10 PM

## 2020-07-10 ENCOUNTER — Ambulatory Visit (HOSPITAL_COMMUNITY)
Admission: RE | Admit: 2020-07-10 | Discharge: 2020-07-10 | Disposition: A | Discharge status: Home / Self Care | Payer: HRSA Program | Source: Ambulatory Visit | Attending: Pulmonary Disease | Admitting: Pulmonary Disease

## 2020-07-10 DIAGNOSIS — U071 COVID-19: Secondary | ICD-10-CM | POA: Diagnosis present

## 2020-07-10 MED ORDER — METHYLPREDNISOLONE SODIUM SUCC 125 MG IJ SOLR: 125.0000 mg | Freq: Once | INTRAMUSCULAR | Status: DC | PRN

## 2020-07-10 MED ORDER — SODIUM CHLORIDE 0.9 % IV SOLN
1200.0000 mg | Freq: Once | INTRAVENOUS | Status: AC
  Administered 2020-07-10: 1200 mg via INTRAVENOUS

## 2020-07-10 MED ORDER — DIPHENHYDRAMINE HCL 50 MG/ML IJ SOLN: 50.0000 mg | Freq: Once | INTRAMUSCULAR | Status: DC | PRN

## 2020-07-10 MED ORDER — SODIUM CHLORIDE 0.9 % IV SOLN: INTRAVENOUS | Status: DC | PRN

## 2020-07-10 MED ORDER — FAMOTIDINE IN NACL 20-0.9 MG/50ML-% IV SOLN: 20.0000 mg | Freq: Once | INTRAVENOUS | Status: DC | PRN

## 2020-07-10 MED ORDER — ALBUTEROL SULFATE HFA 108 (90 BASE) MCG/ACT IN AERS: 2.0000 | INHALATION_SPRAY | Freq: Once | RESPIRATORY_TRACT | Status: DC | PRN

## 2020-07-10 MED ORDER — EPINEPHRINE 0.3 MG/0.3ML IJ SOAJ: 0.3000 mg | Freq: Once | INTRAMUSCULAR | Status: DC | PRN

## 2020-07-10 NOTE — Discharge Instructions (Signed)

## 2020-07-10 NOTE — Progress Notes (Signed)
  Diagnosis: COVID-19  Physician: Dr Wright  Procedure: Covid Infusion Clinic Med: casirivimab\imdevimab infusion - Provided patient with casirivimab\imdevimab fact sheet for patients, parents and caregivers prior to infusion.  Complications: No immediate complications noted.  Discharge: Discharged home   Paige [name] 07/10/2020   

## 2020-07-12 ENCOUNTER — Other Ambulatory Visit: Payer: Self-pay | Admitting: Internal Medicine

## 2020-07-12 MED ORDER — PREDNISONE 10 MG PO TABS: ORAL_TABLET | ORAL | 0 refills | Status: DC

## 2020-10-07 ENCOUNTER — Other Ambulatory Visit: Payer: Self-pay

## 2020-10-07 ENCOUNTER — Ambulatory Visit: Payer: Self-pay | Attending: Oncology

## 2020-10-07 VITALS — BP 128/81 | HR 85 | Temp 98.4°F | Ht 65.15 in | Wt 168.0 lb

## 2020-10-07 DIAGNOSIS — N63 Unspecified lump in unspecified breast: Secondary | ICD-10-CM

## 2020-10-07 NOTE — Progress Notes (Signed)
Patient returned with complaint of targeted left breast pain x 6 weeks.  She had a birads 1 mammogram in June 2021.  Clinical breast exam reveals firm mobile nodularity left breast at 2 o'clock, 1 cm. From areola.  Patient scheduled to return to [facility] on 10/20/20 for diagnostic mammogram, and ultrasound.

## 2020-10-20 ENCOUNTER — Ambulatory Visit
Admission: RE | Admit: 2020-10-20 | Discharge: 2020-10-20 | Disposition: A | Discharge status: Home / Self Care | Payer: Self-pay | Source: Ambulatory Visit | Attending: Oncology | Admitting: Oncology

## 2020-10-20 ENCOUNTER — Other Ambulatory Visit: Payer: Self-pay

## 2020-10-20 DIAGNOSIS — N63 Unspecified lump in unspecified breast: Secondary | ICD-10-CM

## 2020-10-21 NOTE — Progress Notes (Signed)
Letter mailed from [facility] to notify of normal mammogram results.  Patient to return in 6 months for annual screening.  Copy to [facility].

## 2021-03-15 ENCOUNTER — Encounter: Payer: Self-pay | Admitting: Obstetrics and Gynecology

## 2021-03-15 ENCOUNTER — Telehealth: Payer: Self-pay

## 2021-03-15 ENCOUNTER — Other Ambulatory Visit: Payer: Self-pay

## 2021-03-15 ENCOUNTER — Ambulatory Visit (INDEPENDENT_AMBULATORY_CARE_PROVIDER_SITE_OTHER): Payer: Self-pay | Admitting: Obstetrics and Gynecology

## 2021-03-15 ENCOUNTER — Ambulatory Visit
Admission: RE | Admit: 2021-03-15 | Discharge: 2021-03-15 | Disposition: A | Discharge status: Home / Self Care | Payer: Self-pay | Source: Ambulatory Visit | Attending: Obstetrics and Gynecology | Admitting: Obstetrics and Gynecology

## 2021-03-15 VITALS — BP 100/80 | Ht 66.0 in | Wt 152.0 lb

## 2021-03-15 DIAGNOSIS — R102 Pelvic and perineal pain: Secondary | ICD-10-CM | POA: Insufficient documentation

## 2021-03-15 DIAGNOSIS — T192XXA Foreign body in vulva and vagina, initial encounter: Secondary | ICD-10-CM

## 2021-03-15 LAB — POCT URINE PREGNANCY: Preg Test, Ur: NEGATIVE

## 2021-03-15 MED ORDER — DOXYCYCLINE HYCLATE 100 MG PO CAPS: 100.0000 mg | ORAL_CAPSULE | Freq: Two times a day (BID) | ORAL | 0 refills | Status: DC

## 2021-03-15 NOTE — Progress Notes (Signed)
Crecencio Mc, MD   Chief Complaint  Patient presents with  . Pelvic Pain    Left side only since thursday  . vaginal odor    Foul odor during intercourse, No discharge or itchiness x few days    HPI:      [name] is a 45 y.o. No obstetric history on file. whose LMP was Patient's last menstrual period was 03/09/2021 (exact date)., presents today for NP eval of foul vaginal odor with sex, no increased d/c or irritation. Sx for 4 days. Developed LLQ pain 5 days ago, constant and sharp. Intensity increasing now. Taking ibup regularly without sx relief. No GI, urin, vag sx. No LBP, fevers. Had leftover bactrim and took 1 tab last night.  Menses are monthly, lasting 5 days, mod flow, no BTB, severe dysmen. LMP was only 3 days and lighter flow, but started on time.  Pt is sex active, no new partners. S/P TL. Having dyspareunia recently.   Hx of thyroid nodule, normal TSH 12/19 Neg pap/neg HPV DNA 12/19  PT IS SELF PAY, FOLLOWED BY [facility] PROGRAM.  Past Medical History:  Diagnosis Date  . History of kidney stones   . Hx: UTI (urinary tract infection)     Past Surgical History:  Procedure Laterality Date  . LUMBAR LAMINECTOMY/DECOMPRESSION MICRODISCECTOMY Left 12/03/2018   Procedure: LUMBAR LAMINECTOMY/DECOMPRESSION MICRODISCECTOMY 1 LEVEL L5-S1;  Surgeon: [name], MD;  Location: [facility] ORS;  Service: Neurosurgery;  Laterality: Left;  . TUBAL LIGATION  2011    Family History  Problem Relation Age of Onset  . BRCA 1/2 Mother        tested positive for the BRCA Gene  . Breast cancer Maternal Aunt 8  . Breast cancer Maternal Grandmother 55  . Thyroid disease Neg Hx     Social History   Socioeconomic History  . Marital status: Married    Spouse name: Not on file  . Number of children: Not on file  . Years of education: Not on file  . Highest education level: Not on file  Occupational History  . Not on file  Tobacco Use  . Smoking status: Never Smoker   . Smokeless tobacco: Never Used  Vaping Use  . Vaping Use: Never used  Substance and Sexual Activity  . Alcohol use: No    Alcohol/week: 0.0 standard drinks  . Drug use: No  . Sexual activity: Yes    Partners: Male    Birth control/protection: None, Surgical    Comment: married,  3 children , tubal ligation  Other Topics Concern  . Not on file  Social History Narrative   married   Social Determinants of Health   Financial Resource Strain: Not on file  Food Insecurity: Not on file  Transportation Needs: Not on file  Physical Activity: Not on file  Stress: Not on file  Social Connections: Not on file  Intimate Partner Violence: Not on file    Outpatient Medications Prior to Visit  Medication Sig Dispense Refill  . acetaminophen (TYLENOL) 500 MG tablet Take 500 mg by mouth every 6 (six) hours as needed for moderate pain.    Marland Kitchen buPROPion (WELLBUTRIN) 75 MG tablet TAKE 1 TABLET BY MOUTH TWICE A DAY 180 tablet 1  . chlorpheniramine-HYDROcodone (TUSSIONEX PENNKINETIC ER) 10-8 MG/5ML SUER Take 5 mLs by mouth at bedtime as needed. 140 mL 0  . escitalopram (LEXAPRO) 10 MG tablet TAKE 1 TABLET BY MOUTH EVERY DAY 90 tablet 2  .  predniSONE (DELTASONE) 10 MG tablet 6 tablets on Day 1 , then reduce by 1 tablet daily until gone 21 tablet 0   No facility-administered medications prior to visit.      ROS:  Review of Systems  Constitutional: Negative for fever.  Gastrointestinal: Negative for blood in stool, constipation, diarrhea, nausea and vomiting.  Genitourinary: Positive for pelvic pain. Negative for dyspareunia, dysuria, flank pain, frequency, hematuria, urgency, vaginal bleeding, vaginal discharge and vaginal pain.  Musculoskeletal: Negative for back pain.  Skin: Negative for rash.   BREAST: No symptoms   OBJECTIVE:   Vitals:  BP 100/80   Ht 5' 6"  (1.676 m)   Wt 152 lb (68.9 kg)   LMP 03/09/2021 (Exact Date)   BMI 24.53 kg/m   Physical Exam Vitals reviewed.   Constitutional:      Appearance: She is well-developed.  Pulmonary:     Effort: Pulmonary effort is normal.  Abdominal:     Palpations: Abdomen is soft.     Tenderness: There is abdominal tenderness in the right lower quadrant, suprapubic area and left lower quadrant. There is no guarding or rebound.  Genitourinary:    General: Normal vulva.     Pubic Area: No rash.      Labia:        Right: No rash, tenderness or lesion.        Left: No rash, tenderness or lesion.      Vagina: Foreign body present. No vaginal discharge, erythema or tenderness.     Cervix: No cervical motion tenderness.     Uterus: Normal. Tender. Not enlarged.      Adnexa: Right adnexa normal.       Right: No mass or tenderness.         Left: Tenderness present. No mass.       Comments: RETAINED TAMPON VAGINALLY; REMOVED WITHOUT PROBLEM; PT VERY TENDER ON ABD EXAM, LESS TENDER ON PELVIC EXAM; NO CMT Musculoskeletal:        General: Normal range of motion.     Cervical back: Normal range of motion.  Skin:    General: Skin is warm and dry.  Neurological:     General: No focal deficit present.     Mental Status: She is alert and oriented to person, place, and time.  Psychiatric:        Mood and Affect: Mood normal.        Behavior: Behavior normal.        Thought Content: Thought content normal.        Judgment: Judgment normal.     Results: Results for orders placed or performed in visit on 03/15/21 (from the past 24 hour(s))  POCT urine pregnancy     Status: Normal   Collection Time: 03/15/21  4:54 PM  Result Value Ref Range   Preg Test, Ur Negative Negative     Assessment/Plan: Pelvic pain - Plan: doxycycline (VIBRAMYCIN) 100 MG capsule, POCT urine pregnancy, US PELVIC COMPLETE WITH TRANSVAGINAL; worsening sx, pos exam. No CMT, neg UPT. Check GYN u/s. Will f/u with results. Given retained tampon, start Rx doxy (although most likely not cause of pain).   Retained tampon, initial encounter--removed.  Rx doxy. Go to ED if develops fevers, rash, worsening sx.     Meds ordered this encounter  Medications  . doxycycline (VIBRAMYCIN) 100 MG capsule    Sig: Take 1 capsule (100 mg total) by mouth 2 (two) times daily.    Dispense:  14 capsule  Refill:  0    Order Specific Question:   Supervising Provider    Answer:   Gae Dry [[id number]]   PT IS SELF PAY   Return if symptoms worsen or fail to improve.  Jairon Ripberger B. Prajwal Fellner, PA-C 03/15/2021 4:57 PM

## 2021-03-15 NOTE — Patient Instructions (Signed)
I value your feedback and you entrusting us with your care. If you get a [facility] patient survey, I would appreciate you taking the time to let us know about your experience today. Thank you! ? ? ?

## 2021-05-05 NOTE — Telephone Encounter (Signed)
error 

## 2021-09-20 ENCOUNTER — Other Ambulatory Visit: Payer: Self-pay | Admitting: Internal Medicine

## 2022-04-01 ENCOUNTER — Other Ambulatory Visit: Payer: Self-pay

## 2022-04-01 DIAGNOSIS — Z1231 Encounter for screening mammogram for malignant neoplasm of breast: Secondary | ICD-10-CM

## 2022-04-01 DIAGNOSIS — Z1211 Encounter for screening for malignant neoplasm of colon: Secondary | ICD-10-CM

## 2022-04-12 ENCOUNTER — Ambulatory Visit: Payer: Self-pay | Attending: Hematology and Oncology | Admitting: *Deleted

## 2022-04-12 ENCOUNTER — Encounter: Payer: Self-pay | Admitting: *Deleted

## 2022-04-12 ENCOUNTER — Other Ambulatory Visit: Payer: Self-pay

## 2022-04-12 ENCOUNTER — Ambulatory Visit
Admission: RE | Admit: 2022-04-12 | Discharge: 2022-04-12 | Disposition: A | Discharge status: Home / Self Care | Payer: Self-pay | Source: Ambulatory Visit | Attending: Obstetrics and Gynecology | Admitting: Obstetrics and Gynecology

## 2022-04-12 VITALS — BP 131/75 | Wt 164.0 lb

## 2022-04-12 DIAGNOSIS — Z1231 Encounter for screening mammogram for malignant neoplasm of breast: Secondary | ICD-10-CM | POA: Insufficient documentation

## 2022-04-12 DIAGNOSIS — Z1211 Encounter for screening for malignant neoplasm of colon: Secondary | ICD-10-CM

## 2022-04-12 DIAGNOSIS — Z01419 Encounter for gynecological examination (general) (routine) without abnormal findings: Secondary | ICD-10-CM

## 2022-04-12 NOTE — Addendum Note (Signed)
Addended by: Narda Rutherford on: 04/12/2022 10:41 AM   Modules accepted: Orders

## 2022-04-12 NOTE — Addendum Note (Signed)
Addended by: Narda Rutherford on: 04/12/2022 10:44 AM   Modules accepted: Orders

## 2022-04-12 NOTE — Progress Notes (Signed)
Paige [name] is a 45 y.o. female who presents to [facility] clinic today with complaint of non focal left breast pain.  States the pain is all over the lateral left breast.  States she is perimenopausal also.    Pap Smear: Pap not smear completed today. Last Pap smear was 10/03/18 at the [facility] clinic and was normal. Per patient has no history of an abnormal Pap smear. Last Pap smear result is available in Epic.   Physical exam: Breasts Breasts symmetrical. No skin abnormalities bilateral breasts. No nipple retraction bilateral breasts. No nipple discharge bilateral breasts. No lymphadenopathy. No lumps palpated bilateral breasts.       Pelvic/Bimanual Pap is not indicated today    Smoking History: Patient has never smoked    Patient Navigation: Patient education provided. Access to services provided for patient through [facility] program. No interpreter provided. No transportation provided   Colorectal Cancer Screening: Per patient has never had colonoscopy completed.  FIT test given to patient.  No complaints today.    Breast and Cervical Cancer Risk Assessment: Patient does not have family history of breast cancer, known genetic mutations, or radiation treatment to the chest before age 12. Patient does not have history of cervical dysplasia, immunocompromised, or DES exposure in-utero.  Risk Assessment     Risk Scores       04/12/2022 03/31/2020   Last edited by: Narda Rutherford, LPN Paige Presto, RN   5-year risk: 1.5 % 1.4 %   Lifetime risk: 15.3 % 15.7 %            A: [facility] exam without pap smear P: Referred patient to the [facility] for a screening mammogram. Appointment scheduled today.  Jim Like, RN 04/12/2022 10:57 AM

## 2023-03-13 NOTE — Progress Notes (Unsigned)
Paige Shams, MD   No chief complaint on file.   HPI:      [name] is a 47 y.o. No obstetric history on file. whose LMP was No LMP recorded., presents today for ***  Menses are monthly, lasting 5 days, mod flow, no BTB, severe dysmen. LMP was only 3 days and lighter flow, but started on time.  Pt is sex active, no new partners. S/P TL. Having dyspareunia recently. 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 11/06/2018   Chronic pain of both lower extremities 01/23/2018   Lower abdominal pain 01/23/2018   Allergic rhinitis due to pollen 03/30/2017   Concentration deficit 04/23/2016   Abnormal mammogram of right breast 04/23/2016   Thyroid nodule 03/19/2016   Hoarseness 03/19/2016   Other fatigue 03/19/2016   Gastroesophageal reflux disease with esophagitis 03/19/2016   Onychomycosis of toenail 03/19/2016   Anxiety state 03/19/2016    Past Surgical History:  Procedure Laterality Date   LUMBAR LAMINECTOMY/DECOMPRESSION MICRODISCECTOMY Left 12/03/2018   Procedure: LUMBAR LAMINECTOMY/DECOMPRESSION MICRODISCECTOMY 1 LEVEL L5-S1;  Surgeon: Lucy Chris, MD;  Location: [facility] ORS;  Service: Neurosurgery;  Laterality: Left;   TUBAL LIGATION  2011    Family History  Problem Relation Age of Onset   BRCA 1/2 Mother        tested positive for the BRCA Gene   Breast cancer Maternal Aunt 43   Breast cancer Maternal Grandmother 33   Thyroid disease Neg Hx     Social History   Socioeconomic History   Marital status: Married    Spouse name: Not on file   Number of children: 3   Years of education: Not on file   Highest education level: High school graduate  Occupational History   Not on file  Tobacco Use   Smoking status: Never    Smokeless tobacco: Never  Vaping Use   Vaping Use: Never used  Substance and Sexual Activity   Alcohol use: No    Alcohol/week: 0.0 standard drinks of alcohol   Drug use: No   Sexual activity: Yes    Partners: Male    Birth control/protection: None, Surgical    Comment: married,  3 children , tubal ligation  Other Topics Concern   Not on file  Social History Narrative   married   Social Determinants of Health   Financial Resource Strain: Not on file  Food Insecurity: No Food Insecurity (04/12/2022)   Hunger Vital Sign    Worried About Running Out of Food in the Last Year: Never true    Ran Out of Food in the Last Year: Never true  Transportation Needs: No Transportation Needs (04/12/2022)   PRAPARE - Administrator, Civil Service (Medical): No    Lack of Transportation (Non-Medical): No  Physical Activity: Not on file  Stress: Not on file  Social Connections: Not on file  Intimate Partner Violence: Not on file    Outpatient Medications Prior to Visit  Medication Sig Dispense Refill   acetaminophen (TYLENOL) 500 MG tablet Take 500 mg by mouth every 6 (six) hours as needed for moderate pain.     doxycycline (VIBRAMYCIN) 100 MG capsule Take 1 capsule (100 mg total) by mouth 2 (two) times daily. 14  capsule 0   No facility-administered medications prior to visit.      ROS:  Review of Systems BREAST: No symptoms   OBJECTIVE:   Vitals:  There were no vitals taken for this visit.  Physical Exam  Results: No results found for this or any previous visit (from the past 24 hour(s)).   Assessment/Plan: No diagnosis found.    No orders of the defined types were placed in this encounter.     No follow-ups on file.  Kaisey Huseby B. Shenaya Lebo, PA-C 03/13/2023 4:45 PM

## 2023-03-14 ENCOUNTER — Other Ambulatory Visit: Payer: Self-pay | Admitting: Obstetrics and Gynecology

## 2023-03-14 ENCOUNTER — Encounter: Payer: Self-pay | Admitting: Obstetrics and Gynecology

## 2023-03-14 ENCOUNTER — Ambulatory Visit (INDEPENDENT_AMBULATORY_CARE_PROVIDER_SITE_OTHER): Payer: Self-pay | Admitting: Obstetrics and Gynecology

## 2023-03-14 VITALS — BP 100/64 | Ht 66.0 in | Wt 166.0 lb

## 2023-03-14 DIAGNOSIS — N939 Abnormal uterine and vaginal bleeding, unspecified: Secondary | ICD-10-CM

## 2023-03-14 DIAGNOSIS — N924 Excessive bleeding in the premenopausal period: Secondary | ICD-10-CM

## 2023-03-14 DIAGNOSIS — Z124 Encounter for screening for malignant neoplasm of cervix: Secondary | ICD-10-CM

## 2023-03-14 DIAGNOSIS — Z1231 Encounter for screening mammogram for malignant neoplasm of breast: Secondary | ICD-10-CM

## 2023-03-14 DIAGNOSIS — N951 Menopausal and female climacteric states: Secondary | ICD-10-CM

## 2023-03-14 DIAGNOSIS — E041 Nontoxic single thyroid nodule: Secondary | ICD-10-CM

## 2023-03-14 DIAGNOSIS — Z1151 Encounter for screening for human papillomavirus (HPV): Secondary | ICD-10-CM

## 2023-03-15 LAB — TSH+FREE T4
Free T4: 1.18 ng/dL (ref 0.82–1.77)
TSH: 1.25 u[IU]/mL (ref 0.450–4.500)

## 2023-03-15 LAB — CBC WITH DIFFERENTIAL/PLATELET
Basophils Absolute: 0.1 10*3/uL (ref 0.0–0.2)
Basos: 1 %
EOS (ABSOLUTE): 0.6 10*3/uL — ABNORMAL HIGH (ref 0.0–0.4)
Eos: 7 %
Hematocrit: 38.7 % (ref 34.0–46.6)
Hemoglobin: 12.2 g/dL (ref 11.1–15.9)
Immature Grans (Abs): 0 10*3/uL (ref 0.0–0.1)
Immature Granulocytes: 0 %
Lymphocytes Absolute: 2.8 10*3/uL (ref 0.7–3.1)
Lymphs: 32 %
MCH: 27.4 pg (ref 26.6–33.0)
MCHC: 31.5 g/dL (ref 31.5–35.7)
MCV: 87 fL (ref 79–97)
Monocytes Absolute: 0.6 10*3/uL (ref 0.1–0.9)
Monocytes: 7 %
Neutrophils Absolute: 4.6 10*3/uL (ref 1.4–7.0)
Neutrophils: 53 %
Platelets: 324 10*3/uL (ref 150–450)
RBC: 4.46 x10E6/uL (ref 3.77–5.28)
RDW: 13.2 % (ref 11.7–15.4)
WBC: 8.8 10*3/uL (ref 3.4–10.8)

## 2023-03-22 ENCOUNTER — Encounter: Payer: Self-pay | Admitting: Obstetrics and Gynecology

## 2023-03-29 ENCOUNTER — Ambulatory Visit (INDEPENDENT_AMBULATORY_CARE_PROVIDER_SITE_OTHER): Payer: Self-pay

## 2023-03-29 DIAGNOSIS — N939 Abnormal uterine and vaginal bleeding, unspecified: Secondary | ICD-10-CM

## 2023-05-03 NOTE — Progress Notes (Deleted)
PCP: Sherlene Shams, MD   No chief complaint on file.   HPI:      [name] is a 47 y.o. [phone] whose LMP was No LMP recorded. (Menstrual status: Irregular Periods)., presents today for her annual examination.  Her menses are {norm/abn:715}, lasting {number: 22536} days.  Dysmenorrhea {dysmen:716}. She {does:18564} have intermenstrual bleeding. Seen 5/21 for AUB She {does:18564} have vasomotor sx.   Sex activity: {sex active: 315163}. She {does:18564} have vaginal dryness.  Last Pap: 10/03/18  Results were: no abnormalities /neg HPV DNA.  Hx of STDs: {STD hx:14358}  Last mammogram: 04/12/22 Results were: normal--routine follow-up in 12 months There is no FH of breast cancer. There is no FH of ovarian cancer. The patient {does:18564} do self-breast exams.  Colonoscopy: {hx:15363}  Repeat due after 10*** years.   Tobacco use: {tob:20664} Alcohol use: {Alcohol:11675} No drug use Exercise: {exercise:31265}  She {does:18564} get adequate calcium and Vitamin D in her diet.  Labs with PCP.   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 11/06/2018   Chronic pain of both lower extremities 01/23/2018   Lower abdominal pain 01/23/2018   Allergic rhinitis due to pollen 03/30/2017   Concentration deficit 04/23/2016   Abnormal mammogram of right breast 04/23/2016   Thyroid nodule 03/19/2016   Hoarseness 03/19/2016   Other fatigue 03/19/2016   Gastroesophageal reflux disease with esophagitis 03/19/2016   Onychomycosis of toenail 03/19/2016   Anxiety state 03/19/2016    Past Surgical History:  Procedure Laterality Date   LUMBAR LAMINECTOMY/DECOMPRESSION MICRODISCECTOMY Left 12/03/2018   Procedure: LUMBAR LAMINECTOMY/DECOMPRESSION MICRODISCECTOMY 1 LEVEL L5-S1;  Surgeon: Lucy Chris, MD;   Location: [facility] ORS;  Service: Neurosurgery;  Laterality: Left;   TUBAL LIGATION  2011    Family History  Problem Relation Age of Onset   Breast cancer Mother 60   BRCA 1/2 Mother        tested positive for the BRCA Gene   Ovarian cancer Maternal Aunt 38   Breast cancer Maternal Grandmother 70   Thyroid disease Neg Hx     Social History   Socioeconomic History   Marital status: Married    Spouse name: Not on file   Number of children: 3   Years of education: Not on file   Highest education level: High school graduate  Occupational History   Not on file  Tobacco Use   Smoking status: Never   Smokeless tobacco: Never  Vaping Use   Vaping Use: Never used  Substance and Sexual Activity   Alcohol use: No    Alcohol/week: 0.0 standard drinks of alcohol   Drug use: No   Sexual activity: Yes    Partners: Male    Birth control/protection: None, Surgical    Comment: married,  3 children , tubal ligation  Other Topics Concern   Not on file  Social History Narrative   married   Social Determinants of Health   Financial Resource Strain: Not on file  Food Insecurity: No Food Insecurity (04/12/2022)   Hunger Vital Sign    Worried About Running Out of Food in the Last Year: Never true    Ran Out of Food in the Last Year: Never true  Transportation Needs: No Transportation Needs (04/12/2022)   PRAPARE - Administrator, Civil Service (Medical): No  Lack of Transportation (Non-Medical): No  Physical Activity: Not on file  Stress: Not on file  Social Connections: Not on file  Intimate Partner Violence: Not on file    No current outpatient medications on file.     ROS:  Review of Systems BREAST: No symptoms    Objective: There were no vitals taken for this visit.   OBGyn Exam  Results: No results found for this or any previous visit (from the past 24 hour(s)).  Assessment/Plan:  No diagnosis found.   No orders of the defined types were placed  in this encounter.           GYN counsel {counseling: 16159}    F/U  No follow-ups on file.  Kippy Melena B. Brandan Glauber, PA-C 05/03/2023 8:12 PM

## 2023-05-04 ENCOUNTER — Ambulatory Visit: Payer: Self-pay | Admitting: Obstetrics and Gynecology

## 2023-05-04 ENCOUNTER — Other Ambulatory Visit: Payer: Self-pay

## 2023-05-04 DIAGNOSIS — N951 Menopausal and female climacteric states: Secondary | ICD-10-CM

## 2023-05-04 DIAGNOSIS — Z1231 Encounter for screening mammogram for malignant neoplasm of breast: Secondary | ICD-10-CM

## 2023-05-04 DIAGNOSIS — Z124 Encounter for screening for malignant neoplasm of cervix: Secondary | ICD-10-CM

## 2023-05-04 DIAGNOSIS — Z01419 Encounter for gynecological examination (general) (routine) without abnormal findings: Secondary | ICD-10-CM

## 2023-05-04 DIAGNOSIS — Z1211 Encounter for screening for malignant neoplasm of colon: Secondary | ICD-10-CM

## 2023-05-04 DIAGNOSIS — N939 Abnormal uterine and vaginal bleeding, unspecified: Secondary | ICD-10-CM

## 2023-05-04 DIAGNOSIS — Z1151 Encounter for screening for human papillomavirus (HPV): Secondary | ICD-10-CM

## 2023-05-22 ENCOUNTER — Ambulatory Visit: Payer: Self-pay | Attending: Hematology and Oncology | Admitting: Hematology and Oncology

## 2023-05-22 ENCOUNTER — Ambulatory Visit
Admission: RE | Admit: 2023-05-22 | Discharge: 2023-05-22 | Disposition: A | Discharge status: Home / Self Care | Payer: Self-pay | Source: Ambulatory Visit | Attending: Obstetrics and Gynecology | Admitting: Obstetrics and Gynecology

## 2023-05-22 ENCOUNTER — Other Ambulatory Visit: Payer: Self-pay

## 2023-05-22 VITALS — BP 117/71 | Wt 171.2 lb

## 2023-05-22 DIAGNOSIS — Z1231 Encounter for screening mammogram for malignant neoplasm of breast: Secondary | ICD-10-CM | POA: Insufficient documentation

## 2023-05-22 DIAGNOSIS — Z1211 Encounter for screening for malignant neoplasm of colon: Secondary | ICD-10-CM

## 2023-05-22 NOTE — Progress Notes (Signed)
Paige Paige [name] is Paige [name] 47 y.o. female who presents to [facility] clinic today with no complaints.    Pap Smear: Pap not smear completed today. Last Pap smear was 10/03/2018 at [facility] and was normal. Per patient has no history of an abnormal Pap smear. Last Pap smear result is available in Epic.   Physical exam: Breasts Breasts symmetrical. No skin abnormalities bilateral breasts. No nipple retraction bilateral breasts. No nipple discharge bilateral breasts. No lymphadenopathy. No lumps palpated bilateral breasts.  MS DIGITAL SCREENING TOMO BILATERAL  Result Date: 04/13/2022 CLINICAL DATA:  Screening. EXAM: DIGITAL SCREENING BILATERAL MAMMOGRAM WITH TOMOSYNTHESIS AND CAD TECHNIQUE: Bilateral screening digital craniocaudal and mediolateral oblique mammograms were obtained. Bilateral screening digital breast tomosynthesis was performed. The images were evaluated with computer-aided detection. COMPARISON:  Previous exam(s). ACR Breast Density Category c: The breast tissue is heterogeneously dense, which may obscure small masses. FINDINGS: There are no findings suspicious for malignancy. IMPRESSION: No mammographic evidence of malignancy. Paige [name] will be mailed directly to the patient. RECOMMENDATION: Screening mammogram in one year. (Code:SM-B-01Y) BI-RADS CATEGORY  1: Negative. Electronically Signed   By: Sherian Rein M.D.   On: 04/13/2022 13:08   MS DIGITAL DIAG TOMO BILAT  Result Date: 10/20/2020 CLINICAL DATA:  Focal pain and palpable lump left breast EXAM: DIGITAL DIAGNOSTIC bilateral MAMMOGRAM WITH CAD AND TOMO ULTRASOUND left BREAST COMPARISON:  Prior films ACR Breast Density Category c: The breast tissue is heterogeneously dense, which may obscure small masses. FINDINGS: Cc and MLO views of bilateral breasts are submitted. There is question pectoral distortion in the upper right breast which does not persist on spot compression view. No suspicious  abnormalities identified in the left breast. Mammographic images were processed with CAD. Targeted ultrasound is performed, showing 6 mm simple cyst at the focal area pain left breast 1 o'clock 3 cm from nipple. At the left breast 1:30 o'clock 3 cm from nipple, there is minimal complicated cyst measuring 4 mm. IMPRESSION: Benign findings. RECOMMENDATION: Routine screening mammogram in 1 year. I have discussed the findings and recommendations with the patient. If applicable, Paige [name] reminder letter will be sent to the patient regarding the next appointment. BI-RADS CATEGORY  2: Benign. Electronically Signed   By: Sherian Rein M.D.   On: 10/20/2020 15:24   MS DIGITAL SCREENING TOMO BILATERAL  Result Date: 04/01/2020 CLINICAL DATA:  Screening. EXAM: DIGITAL SCREENING BILATERAL MAMMOGRAM WITH TOMO AND CAD COMPARISON:  Previous exam(s). ACR Breast Density Category c: The breast tissue is heterogeneously dense, which may obscure small masses. FINDINGS: There are no findings suspicious for malignancy. Images were processed with CAD. IMPRESSION: No mammographic evidence of malignancy. Paige [name] will be mailed directly to the patient. RECOMMENDATION: Screening mammogram in one year. (Code:SM-B-01Y) BI-RADS CATEGORY  1: Negative. Electronically Signed   By: Annia Belt M.D.   On: 04/01/2020 12:30   MS DIGITAL DIAG TOMO BILAT  Result Date: 10/03/2018 CLINICAL DATA:  Patient's physician palpated abnormalities in the 4 o'clock region of the right breast and the 6 o'clock region of the left. EXAM: DIGITAL DIAGNOSTIC BILATERAL MAMMOGRAM WITH CAD AND TOMO ULTRASOUND BILATERAL BREAST COMPARISON:  Previous exam(s). ACR Breast Density Category c: The breast tissue is heterogeneously dense, which may obscure small masses. FINDINGS: No suspicious mass, malignant type microcalcifications or distortion detected in either breast. Mammographic images were processed with CAD. On physical exam, I do not  palpate Paige [name] mass in the 4  o'clock region of the right breast or the 6 o'clock region of the left breast. Targeted ultrasound is performed, showing normal tissue in the areas of clinical concern in the 4 o'clock region of the right breast and 6 o'clock region of the left breast. No solid or cystic mass, abnormal shadowing or distortion visualized. IMPRESSION: No evidence of malignancy in either breast. RECOMMENDATION: Bilateral screening mammogram in 1 year is recommended. I have discussed the findings and recommendations with the patient. Results were also provided in writing at the conclusion of the visit. If applicable, Paige [name] reminder letter will be sent to the patient regarding the next appointment. BI-RADS CATEGORY  1: Negative. Electronically Signed   By: Baird Lyons M.D.   On: 10/03/2018 14:19         Pelvic/Bimanual Pap is not indicated today    Smoking History: Patient has never smoked and was not referred to quit line.    Patient Navigation: Patient education provided. Access to services provided for patient through [facility] program. No interpreter provided. No transportation provided   Colorectal Cancer Screening: Per patient has never had colonoscopy completed No complaints today. FIT test given.    Breast and Cervical Cancer Risk Assessment: Patient has family history of breast cancer, with her mother. Mother is BRCA2 +; patient is BRCA2-. Patient does not have history of cervical dysplasia, immunocompromised, or DES exposure in-utero.  Risk Scores as of Encounter on 05/22/2023     Paige Paige [name]           5-year 1.58%   Lifetime 15.52%            Last calculated by Paige Rutherford, LPN on 2/95/2841 at  1:59 PM        Paige [name]: B[facility]CP exam without pap smear No complaints with benign exam.   P: Referred patient to the [facility] for Paige [name] screening mammogram. Appointment scheduled 05/22/23.  Paige Paige [name] A, NP 05/22/2023 2:06 PM

## 2023-05-22 NOTE — Patient Instructions (Signed)
Taught Paige [name] about self breast awareness and gave educational materials to take home. Patient did not need a Pap smear today due to last Pap smear was in 10/03/2018 per patient.  Let her know [facility] will cover Pap smears every 5 years unless has a history of abnormal Pap smears. Referred patient to the [facility] for screening mammogram. Appointment scheduled for 05/22/2023. Patient aware of appointment and will be there. Let patient know will follow up with her within the next couple weeks with results. Paige [name] verbalized understanding.  Pascal Lux, NP 2:09 PM

## 2023-06-30 NOTE — Progress Notes (Deleted)
PCP: Sherlene Shams, MD   No chief complaint on file.   HPI:      [name] is a 47 y.o. [phone] whose LMP was No LMP recorded. (Menstrual status: Irregular Periods)., presents today for her annual examination.  Her menses are {norm/abn:715}, lasting {number: 22536} days.  Dysmenorrhea {dysmen:716}. She {does:18564} have intermenstrual bleeding.  Pt is perimenopausal, having periods Q3-6 months for the past 1 1/2-2 yrs, lasting 4 days, heavy flow with dime sized clots/"accidents at work", no BTB, mod dysmen, not fully relieved with ibup. Past few cycles, menses were Q3 wks twice and Q2 wks most recently. Flow/duration/dysmen the same. Fibroid 1:1.90 x 2.16 x 1.69 cm   RTO 1.75 x 2.28 x 1.53cm  She {does:18564} have vasomotor sx.   Sex activity: {sex active: 315163}. She {does:18564} have vaginal dryness.  Last Pap: 10/03/18 Results were: no abnormalities /neg HPV DNA.  Hx of STDs: {STD hx:14358}  Last mammogram: 05/22/23 Results were: normal--routine follow-up in 12 months There is no FH of breast cancer. There is no FH of ovarian cancer. The patient {does:18564} do self-breast exams.  Colonoscopy: {hx:15363}  Repeat due after 10*** years.   Tobacco use: {tob:20664} Alcohol use: {Alcohol:11675} No drug use Exercise: {exercise:31265}  She {does:18564} get adequate calcium and Vitamin D in her diet.  Labs with PCP.   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 11/06/2018   Chronic pain of both lower extremities 01/23/2018   Lower abdominal pain 01/23/2018   Allergic rhinitis due to pollen 03/30/2017   Concentration deficit 04/23/2016   Abnormal mammogram of right breast 04/23/2016   Thyroid nodule 03/19/2016   Hoarseness 03/19/2016   Other fatigue 03/19/2016   Gastroesophageal reflux  disease with esophagitis 03/19/2016   Onychomycosis of toenail 03/19/2016   Anxiety state 03/19/2016    Past Surgical History:  Procedure Laterality Date   LUMBAR LAMINECTOMY/DECOMPRESSION MICRODISCECTOMY Left 12/03/2018   Procedure: LUMBAR LAMINECTOMY/DECOMPRESSION MICRODISCECTOMY 1 LEVEL L5-S1;  Surgeon: Lucy Chris, MD;  Location: [facility] ORS;  Service: Neurosurgery;  Laterality: Left;   TUBAL LIGATION  2011    Family History  Problem Relation Age of Onset   Breast cancer Mother 51   BRCA 1/2 Mother        tested positive for the BRCA Gene   Ovarian cancer Maternal Aunt 38   Breast cancer Maternal Grandmother 49   Thyroid disease Neg Hx     Social History   Socioeconomic History   Marital status: Married    Spouse name: Not on file   Number of children: 3   Years of education: Not on file   Highest education level: High school graduate  Occupational History   Not on file  Tobacco Use   Smoking status: Never   Smokeless tobacco: Never  Vaping Use   Vaping status: Never Used  Substance and Sexual Activity   Alcohol use: No    Alcohol/week: 0.0 standard drinks of alcohol   Drug use: No   Sexual activity: Yes    Partners: Male    Birth control/protection: None, Surgical    Comment: married,  3 children , tubal ligation  Other Topics Concern   Not on file  Social History Narrative   married   Social Determinants of Health   Financial Resource Strain: Not on  file  Food Insecurity: No Food Insecurity (05/22/2023)   Hunger Vital Sign    Worried About Running Out of Food in the Last Year: Never true    Ran Out of Food in the Last Year: Never true  Transportation Needs: No Transportation Needs (05/22/2023)   PRAPARE - Administrator, Civil Service (Medical): No    Lack of Transportation (Non-Medical): No  Physical Activity: Not on file  Stress: Not on file  Social Connections: Not on file  Intimate Partner Violence: Not on file    No current  outpatient medications on file.     ROS:  Review of Systems BREAST: No symptoms    Objective: There were no vitals taken for this visit.   OBGyn Exam  Results: No results found for this or any previous visit (from the past 24 hour(s)).  Assessment/Plan:  No diagnosis found.   No orders of the defined types were placed in this encounter.           GYN counsel {counseling: 16159}    F/U  No follow-ups on file.  Valera Vallas B. Jullien Granquist, PA-C 06/30/2023 4:34 PM

## 2023-07-03 ENCOUNTER — Ambulatory Visit: Payer: Self-pay | Admitting: Obstetrics and Gynecology

## 2023-07-03 DIAGNOSIS — N951 Menopausal and female climacteric states: Secondary | ICD-10-CM

## 2023-07-03 DIAGNOSIS — Z01419 Encounter for gynecological examination (general) (routine) without abnormal findings: Secondary | ICD-10-CM

## 2023-07-03 DIAGNOSIS — Z124 Encounter for screening for malignant neoplasm of cervix: Secondary | ICD-10-CM

## 2023-07-03 DIAGNOSIS — N939 Abnormal uterine and vaginal bleeding, unspecified: Secondary | ICD-10-CM

## 2023-07-03 DIAGNOSIS — Z1231 Encounter for screening mammogram for malignant neoplasm of breast: Secondary | ICD-10-CM

## 2023-07-03 DIAGNOSIS — Z1151 Encounter for screening for human papillomavirus (HPV): Secondary | ICD-10-CM

## 2023-07-03 DIAGNOSIS — E041 Nontoxic single thyroid nodule: Secondary | ICD-10-CM

## 2023-08-09 NOTE — Progress Notes (Unsigned)
PCP: Sherlene Shams, MD   No chief complaint on file.   HPI:      [name] is a 47 y.o. [phone] whose LMP was No LMP recorded. (Menstrual status: Irregular Periods)., presents today for her annual examination.  Her menses are {norm/abn:715}, lasting {number: 22536} days.  Dysmenorrhea {dysmen:716}. She {does:18564} have intermenstrual bleeding. Small leio on 6;24 u/s**** She {does:18564} have vasomotor sx.   Sex activity: {sex active: 315163}. She {does:18564} have vaginal dryness.  Last Pap: 10/03/18  Results were: no abnormalities /neg HPV DNA.  Hx of STDs: {STD hx:14358}  Last mammogram: 05/22/23 Results were: normal--routine follow-up in 12 months There is no FH of breast cancer. There is no FH of ovarian cancer. The patient {does:18564} do self-breast exams.  Colonoscopy: {hx:15363}  Repeat due after 10*** years.   Tobacco use: {tob:20664} Alcohol use: {Alcohol:11675} No drug use Exercise: {exercise:31265}  She {does:18564} get adequate calcium and Vitamin D in her diet.  Labs with PCP.   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 11/06/2018   Chronic pain of both lower extremities 01/23/2018   Lower abdominal pain 01/23/2018   Allergic rhinitis due to pollen 03/30/2017   Concentration deficit 04/23/2016   Abnormal mammogram of right breast 04/23/2016   Thyroid nodule 03/19/2016   Hoarseness 03/19/2016   Other fatigue 03/19/2016   Gastroesophageal reflux disease with esophagitis 03/19/2016   Onychomycosis of toenail 03/19/2016   Anxiety state 03/19/2016    Past Surgical History:  Procedure Laterality Date   LUMBAR LAMINECTOMY/DECOMPRESSION MICRODISCECTOMY Left 12/03/2018   Procedure: LUMBAR LAMINECTOMY/DECOMPRESSION MICRODISCECTOMY 1 LEVEL L5-S1;  Surgeon: Lucy Chris, MD;  Location: [facility] OR[facility];  Service: Neurosurgery;  Laterality: Left;   TUBAL LIGATION  2011    Family History  Problem Relation Age of Onset   Breast cancer Mother 63   BRCA 1/2 Mother        tested positive for the BRCA Gene   Ovarian cancer Maternal Aunt 38   Breast cancer Maternal Grandmother 25   Thyroid disease Neg Hx     Social History   Socioeconomic History   Marital status: Married    Spouse name: Not on file   Number of children: 3   Years of education: Not on file   Highest education level: High school graduate  Occupational History   Not on file  Tobacco Use   Smoking status: Never   Smokeless tobacco: Never  Vaping Use   Vaping status: Never Used  Substance and Sexual Activity   Alcohol use: No    Alcohol/week: 0.0 standard drinks of alcohol   Drug use: No   Sexual activity: Yes    Partners: Male    Birth control/protection: None, Surgical    Comment: married,  3 children , tubal ligation  Other Topics Concern   Not on file  Social History Narrative   married   Social Determinants of Health   Financial Resource Strain: Not on file  Food Insecurity: No Food Insecurity (05/22/2023)   Hunger Vital Sign    Worried About Running Out of Food in the Last Year: Never true    Ran Out of Food in the Last Year: Never true  Transportation Needs: No Transportation Needs (05/22/2023)   PRAPARE - Administrator, Civil Service (Medical):  No    Lack of Transportation (Non-Medical): No  Physical Activity: Not on file  Stress: Not on file  Social Connections: Not on file  Intimate Partner Violence: Not on file    No current outpatient medications on file.     ROS:  Review of Systems BREAST: No symptoms    Objective: There were no vitals taken for this visit.   OBGyn Exam  Results: No results found for this or any previous visit (from the past 24 hour(s)).  Assessment/Plan:  No diagnosis found.   No orders of the defined  types were placed in this encounter.           GYN counsel {counseling: 16159}    F/U  No follow-ups on file.  Neely Kammerer B. Kaedynce Tapp, PA-C 08/09/2023 2:44 PM

## 2023-08-10 ENCOUNTER — Ambulatory Visit (INDEPENDENT_AMBULATORY_CARE_PROVIDER_SITE_OTHER): Payer: Self-pay | Admitting: Obstetrics and Gynecology

## 2023-08-10 ENCOUNTER — Other Ambulatory Visit (HOSPITAL_COMMUNITY)
Admission: RE | Admit: 2023-08-10 | Discharge: 2023-08-10 | Disposition: A | Discharge status: Home / Self Care | Payer: Self-pay | Source: Ambulatory Visit | Attending: Obstetrics and Gynecology | Admitting: Obstetrics and Gynecology

## 2023-08-10 ENCOUNTER — Encounter: Payer: Self-pay | Admitting: Obstetrics and Gynecology

## 2023-08-10 VITALS — Ht 64.0 in | Wt 169.6 lb

## 2023-08-10 DIAGNOSIS — Z01419 Encounter for gynecological examination (general) (routine) without abnormal findings: Secondary | ICD-10-CM

## 2023-08-10 DIAGNOSIS — Z780 Asymptomatic menopausal state: Secondary | ICD-10-CM

## 2023-08-10 DIAGNOSIS — E041 Nontoxic single thyroid nodule: Secondary | ICD-10-CM

## 2023-08-10 DIAGNOSIS — M2550 Pain in unspecified joint: Secondary | ICD-10-CM

## 2023-08-10 DIAGNOSIS — Z124 Encounter for screening for malignant neoplasm of cervix: Secondary | ICD-10-CM

## 2023-08-10 DIAGNOSIS — Z8481 Family history of carrier of genetic disease: Secondary | ICD-10-CM | POA: Insufficient documentation

## 2023-08-10 DIAGNOSIS — Z803 Family history of malignant neoplasm of breast: Secondary | ICD-10-CM | POA: Insufficient documentation

## 2023-08-10 DIAGNOSIS — Z1151 Encounter for screening for human papillomavirus (HPV): Secondary | ICD-10-CM | POA: Insufficient documentation

## 2023-08-10 DIAGNOSIS — N939 Abnormal uterine and vaginal bleeding, unspecified: Secondary | ICD-10-CM

## 2023-08-10 DIAGNOSIS — Z1211 Encounter for screening for malignant neoplasm of colon: Secondary | ICD-10-CM

## 2023-08-10 DIAGNOSIS — Z1231 Encounter for screening mammogram for malignant neoplasm of breast: Secondary | ICD-10-CM

## 2023-08-10 NOTE — Patient Instructions (Signed)
I value your feedback and you entrusting us with your care. If you get a [facility] patient survey, I would appreciate you taking the time to let us know about your experience today. Thank you! ? ? ?

## 2023-08-11 LAB — COMPREHENSIVE METABOLIC PANEL
ALT: 21 [IU]/L (ref 0–32)
AST: 18 [IU]/L (ref 0–40)
Albumin: 4.4 g/dL (ref 3.9–4.9)
Alkaline Phosphatase: 101 [IU]/L (ref 44–121)
BUN/Creatinine Ratio: 16 (ref 9–23)
BUN: 14 mg/dL (ref 6–24)
Bilirubin Total: <0.2 mg/dL (ref 0.0–1.2)
CO2: 23 mmol/L (ref 20–29)
Calcium: 9.7 mg/dL (ref 8.7–10.2)
Chloride: 105 mmol/L (ref 96–106)
Creatinine, Ser: 0.9 mg/dL (ref 0.57–1.00)
Globulin, Total: 2.1 g/dL (ref 1.5–4.5)
Glucose: 82 mg/dL (ref 70–99)
Potassium: 3.9 mmol/L (ref 3.5–5.2)
Sodium: 142 mmol/L (ref 134–144)
Total Protein: 6.5 g/dL (ref 6.0–8.5)
eGFR: 78 mL/min/{1.73_m2} (ref >59)

## 2023-08-11 LAB — FOLLICLE STIMULATING HORMONE: FSH: 40.3 m[IU]/mL

## 2023-08-11 LAB — CBC WITH DIFFERENTIAL/PLATELET
Basophils Absolute: 0 10*3/uL (ref 0.0–0.2)
Basos: 1 %
EOS (ABSOLUTE): 0.6 10*3/uL — ABNORMAL HIGH (ref 0.0–0.4)
Eos: 9 %
Hematocrit: 39.3 % (ref 34.0–46.6)
Hemoglobin: 12.5 g/dL (ref 11.1–15.9)
Immature Grans (Abs): 0 10*3/uL (ref 0.0–0.1)
Immature Granulocytes: 0 %
Lymphocytes Absolute: 2.4 10*3/uL (ref 0.7–3.1)
Lymphs: 34 %
MCH: 27.4 pg (ref 26.6–33.0)
MCHC: 31.8 g/dL (ref 31.5–35.7)
MCV: 86 fL (ref 79–97)
Monocytes Absolute: 0.5 10*3/uL (ref 0.1–0.9)
Monocytes: 7 %
Neutrophils Absolute: 3.4 10*3/uL (ref 1.4–7.0)
Neutrophils: 49 %
Platelets: 290 10*3/uL (ref 150–450)
RBC: 4.57 x10E6/uL (ref 3.77–5.28)
RDW: 13.5 % (ref 11.7–15.4)
WBC: 7 10*3/uL (ref 3.4–10.8)

## 2023-08-11 LAB — C-REACTIVE PROTEIN: CRP: 3 mg/L (ref 0–10)

## 2023-08-11 LAB — PROGESTERONE: Progesterone: 0.2 ng/mL

## 2023-08-11 LAB — TSH+FREE T4
Free T4: 1.01 ng/dL (ref 0.82–1.77)
TSH: 1.25 u[IU]/mL (ref 0.450–4.500)

## 2023-08-11 LAB — SEDIMENTATION RATE: Sed Rate: 2 mm/h (ref 0–32)

## 2023-08-11 LAB — ESTRADIOL: Estradiol: 40.2 pg/mL

## 2023-08-11 LAB — VITAMIN D 25 HYDROXY (VIT D DEFICIENCY, FRACTURES): Vit D, 25-Hydroxy: 41.9 ng/mL (ref 30.0–100.0)

## 2023-08-14 ENCOUNTER — Telehealth: Payer: Self-pay

## 2023-08-14 NOTE — Telephone Encounter (Signed)
Pt aware. 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 Will f/u as needed.

## 2023-08-14 NOTE — Telephone Encounter (Signed)
Received fax from [employer] needing insurance information for pt. Pt does not have insurance. Reached out to pt and she said [facility] had already contacted her that out of pocket for test would be $700. Pt said she was given a "packet" when she went to get mammogram done earlier this year and in that packet there was a colon test she can send out. She said she would do that first and then f/u with you if needed. She also mentioned you told her colonoscopys are free.

## 2023-08-14 NOTE — Telephone Encounter (Signed)
Colonoscopies are free with insurance. Didn't know she didn't have private insurance. Pls let me know if/when she gets insurance and can order cologuard or colonoscopy for her.

## 2023-08-15 LAB — CYTOLOGY - PAP
Comment: NEGATIVE
Diagnosis: NEGATIVE
High risk HPV: NEGATIVE

## 2023-08-15 NOTE — Telephone Encounter (Signed)
Called [facility] to cancel order for pt so we can stop receiving faxes asking for pt's insurance. Pt does not have private insurance and she is not going to pay out of pocket.

## 2023-08-29 ENCOUNTER — Ambulatory Visit: Payer: Self-pay | Admitting: Podiatry

## 2023-09-12 ENCOUNTER — Ambulatory Visit (INDEPENDENT_AMBULATORY_CARE_PROVIDER_SITE_OTHER): Payer: Self-pay

## 2023-09-12 ENCOUNTER — Encounter: Payer: Self-pay | Admitting: Podiatry

## 2023-09-12 ENCOUNTER — Ambulatory Visit (INDEPENDENT_AMBULATORY_CARE_PROVIDER_SITE_OTHER): Payer: Self-pay | Admitting: Podiatry

## 2023-09-12 VITALS — Ht 64.0 in | Wt 169.6 lb

## 2023-09-12 DIAGNOSIS — M79671 Pain in right foot: Secondary | ICD-10-CM

## 2023-09-12 DIAGNOSIS — M79672 Pain in left foot: Secondary | ICD-10-CM

## 2023-09-12 DIAGNOSIS — M7752 Other enthesopathy of left foot: Secondary | ICD-10-CM

## 2023-09-12 DIAGNOSIS — M7751 Other enthesopathy of right foot: Secondary | ICD-10-CM

## 2023-09-12 MED ORDER — MELOXICAM 15 MG PO TABS: 15.0000 mg | ORAL_TABLET | Freq: Every day | ORAL | 0 refills | Status: DC

## 2023-09-12 MED ORDER — METHYLPREDNISOLONE 4 MG PO TBPK
ORAL_TABLET | ORAL | 0 refills | Status: AC
Start: 2023-09-12 — End: ?

## 2023-09-12 NOTE — Progress Notes (Signed)
  Subjective:  Patient ID: Paige [name], female    DOB: 12-06-1975,  MRN: [id number]  Chief Complaint  Patient presents with   Foot Pain    Pt is here due to bilateral foot pain states pain has been there for 4-5 months with no injury.    47 y.o. female presents with the above complaint.  Patient presents with bilateral first metatarsophalangeal joint pain with underlying bunions deformity.  Patient states is painful to touch is progressive and worse hurts with ambulation worse.  Pressure.  Has been present for 4 to 5 months.  No known injury.  Pain scale 7 out of 10 dull aching nature.   Review of Systems: Negative except as noted in the HPI. Denies N/V/F/Ch.  Past Medical History:  Diagnosis Date   BRCA negative 04/2015   pt is BRCA 2 NEG!   Family history of BRCA2 gene positive    pt's mom   History of kidney stones    Hx: UTI (urinary tract infection)     Current Outpatient Medications:    meloxicam (MOBIC) 15 MG tablet, Take 1 tablet (15 mg total) by mouth daily., Disp: 30 tablet, Rfl: 0   methylPREDNISolone (MEDROL DOSEPAK) 4 MG TBPK tablet, Take as directed, Disp: 21 each, Rfl: 0  Social History   Tobacco Use  Smoking Status Never  Smokeless Tobacco Never    No Known Allergies Objective:  There were no vitals filed for this visit. Body mass index is 29.11 kg/m. Constitutional Well developed. Well nourished.  Vascular Dorsalis pedis pulses palpable bilaterally. Posterior tibial pulses palpable bilaterally. Capillary refill normal to all digits.  No cyanosis or clubbing noted. Pedal hair growth normal.  Neurologic Normal speech. Oriented to person, place, and time. Epicritic sensation to light touch grossly present bilaterally.  Dermatologic Nails well groomed and normal in appearance. No open wounds. No skin lesions.  Orthopedic: Pain on palpation first metatarsophalangeal joint.  No osteoarthritis noted.  Moderate bunion deformity clinically appreciated  pain on palpation to the mid bunion deformity.  No intra-articular first MPJ pain noted.   Radiographs: 3 views of skeletally mature bilateral moderate bunion deformity noted.  Sesamoid position 5 out of 7.  Metatarsal parabola maintained Assessment:   1. Capsulitis of metatarsophalangeal (MTP) joint of right foot   2. Capsulitis of metatarsophalangeal (MTP) joint of left foot    Plan:  Patient was evaluated and treated and all questions answered.  Bilateral first MTP capsulitis with underlying moderate bunion deformity -All questions and concerns were discussed with the patient in extensive detail given the amount of pain that she is experiencing she will benefit from a steroid injection to help decrease inflammatory component associate with pain.  Patient agrees with the plan would like to proceed with steroid injection. -A steroid injection was performed at bilateral first MTP using 1% plain Lidocaine and 10 mg of Kenalog. This was well tolerated. - In the future patient will benefit from shoe gear modification as well as possible orthotics.  I will discuss it further if it continues to regress  No follow-ups on file.

## 2023-10-10 ENCOUNTER — Encounter: Payer: Self-pay | Admitting: Podiatry

## 2023-10-10 ENCOUNTER — Ambulatory Visit (INDEPENDENT_AMBULATORY_CARE_PROVIDER_SITE_OTHER): Payer: Self-pay | Admitting: Podiatry

## 2023-10-10 VITALS — Ht 64.0 in | Wt 169.6 lb

## 2023-10-10 DIAGNOSIS — M7752 Other enthesopathy of left foot: Secondary | ICD-10-CM

## 2023-10-10 DIAGNOSIS — M7751 Other enthesopathy of right foot: Secondary | ICD-10-CM

## 2023-10-10 DIAGNOSIS — Q666 Other congenital valgus deformities of feet: Secondary | ICD-10-CM

## 2023-10-10 NOTE — Progress Notes (Signed)
  Subjective:  Patient ID: Paige [name], female    DOB: 26-Oct-1975,  MRN: [id number]  Chief Complaint  Patient presents with   Foot Pain    Pt is here to f/u on bilateral foot pain. Pt states both feet feel a lot better, states shots helped.    47 y.o. female presents with the above complaint.  Patient presents for follow-up of right bilateral first metatarsophalangeal joint pain.  She states she is doing a lot better.  No further pain.  If it continues to hurt she will come back and see me.  She would like to discuss orthotics options   Review of Systems: Negative except as noted in the HPI. Denies N/V/F/Ch.  Past Medical History:  Diagnosis Date   BRCA negative 04/2015   pt is BRCA 2 NEG!   Family history of BRCA2 gene positive    pt's mom   History of kidney stones    Hx: UTI (urinary tract infection)     Current Outpatient Medications:    meloxicam (MOBIC) 15 MG tablet, Take 1 tablet (15 mg total) by mouth daily., Disp: 30 tablet, Rfl: 0   methylPREDNISolone (MEDROL DOSEPAK) 4 MG TBPK tablet, Take as directed, Disp: 21 each, Rfl: 0  Social History   Tobacco Use  Smoking Status Never  Smokeless Tobacco Never    No Known Allergies Objective:  There were no vitals filed for this visit. Body mass index is 29.11 kg/m. Constitutional Well developed. Well nourished.  Vascular Dorsalis pedis pulses palpable bilaterally. Posterior tibial pulses palpable bilaterally. Capillary refill normal to all digits.  No cyanosis or clubbing noted. Pedal hair growth normal.  Neurologic Normal speech. Oriented to person, place, and time. Epicritic sensation to light touch grossly present bilaterally.  Dermatologic Nails well groomed and normal in appearance. No open wounds. No skin lesions.  Orthopedic: No further pain on palpation first metatarsophalangeal joint.  No osteoarthritis noted.  Moderate bunion deformity clinically appreciated pain on palpation to the mid bunion  deformity.  No intra-articular first MPJ pain noted.   Radiographs: 3 views of skeletally mature bilateral moderate bunion deformity noted.  Sesamoid position 5 out of 7.  Metatarsal parabola maintained Assessment:   1. Capsulitis of metatarsophalangeal (MTP) joint of right foot   2. Capsulitis of metatarsophalangeal (MTP) joint of left foot   3. Pes planovalgus     Plan:  Patient was evaluated and treated and all questions answered.  Bilateral first MTP capsulitis with underlying moderate bunion deformity -All questions and concerns were discussed with the patient in extensive detail clinically doing much better pain completely resolved.  At this time if it continues to regress we will discuss surgical options.  I discussed bunion options.  She will think about it and will get back to me  Pes planovalgus -I explained to patient the etiology of pes planovalgus and relationship with bunion deformity and various treatment options were discussed.  Given patient foot structure in the setting of bunion deformity I believe patient will benefit from custom-made orthotics to help control the hindfoot motion support the arch of the foot and take the stress away from plantar fascial.  Patient agrees with the plan like to proceed with orthotics -Patient was casted for orthotics with offloading of submetatarsal 2   No follow-ups on file.

## 2023-10-13 ENCOUNTER — Other Ambulatory Visit: Payer: Self-pay | Admitting: Podiatry

## 2023-11-17 ENCOUNTER — Telehealth: Payer: Self-pay

## 2023-11-17 NOTE — Telephone Encounter (Signed)
[facility] in [place]

## 2023-11-21 ENCOUNTER — Encounter: Payer: Self-pay | Admitting: Podiatry

## 2023-11-21 ENCOUNTER — Ambulatory Visit (INDEPENDENT_AMBULATORY_CARE_PROVIDER_SITE_OTHER): Payer: Self-pay | Admitting: Podiatry

## 2023-11-21 VITALS — Ht 64.0 in | Wt 169.6 lb

## 2023-11-21 DIAGNOSIS — M7751 Other enthesopathy of right foot: Secondary | ICD-10-CM

## 2023-11-21 DIAGNOSIS — M7752 Other enthesopathy of left foot: Secondary | ICD-10-CM

## 2023-11-21 NOTE — Progress Notes (Signed)
Orthotics dispensed.  They are functioning and fitting well.  No acute issues noted
